# Patient Record
Sex: Female | Born: 1960
Health system: Southern US, Community
[De-identification: ages and names within clinical notes are randomized; demographics above are authoritative.]

## PROBLEM LIST (undated history)

## (undated) DIAGNOSIS — M1990 Unspecified osteoarthritis, unspecified site: Secondary | ICD-10-CM

## (undated) DIAGNOSIS — I10 Essential (primary) hypertension: Secondary | ICD-10-CM

---

## 2004-05-31 ENCOUNTER — Other Ambulatory Visit: Admission: RE | Admit: 2004-05-31 | Discharge: 2004-05-31 | Payer: Self-pay | Admitting: Family Medicine

## 2005-05-13 ENCOUNTER — Emergency Department (HOSPITAL_COMMUNITY): Admission: EM | Admit: 2005-05-13 | Discharge: 2005-05-13 | Payer: Self-pay | Admitting: Emergency Medicine

## 2007-03-12 ENCOUNTER — Ambulatory Visit (HOSPITAL_COMMUNITY): Admission: RE | Admit: 2007-03-12 | Discharge: 2007-03-12 | Payer: Self-pay | Admitting: Family Medicine

## 2007-03-13 ENCOUNTER — Ambulatory Visit (HOSPITAL_COMMUNITY): Admission: RE | Admit: 2007-03-13 | Discharge: 2007-03-13 | Payer: Self-pay | Admitting: Family Medicine

## 2007-03-20 ENCOUNTER — Encounter (INDEPENDENT_AMBULATORY_CARE_PROVIDER_SITE_OTHER): Payer: Self-pay | Admitting: Diagnostic Radiology

## 2007-03-20 ENCOUNTER — Other Ambulatory Visit: Admission: RE | Admit: 2007-03-20 | Discharge: 2007-03-20 | Payer: Self-pay | Admitting: Diagnostic Radiology

## 2007-03-20 ENCOUNTER — Encounter: Admission: RE | Admit: 2007-03-20 | Discharge: 2007-03-20 | Payer: Self-pay | Admitting: Family Medicine

## 2010-06-19 ENCOUNTER — Encounter: Payer: Self-pay | Admitting: Endocrinology

## 2011-01-09 ENCOUNTER — Encounter: Payer: Self-pay | Admitting: Podiatry

## 2011-07-31 ENCOUNTER — Ambulatory Visit: Payer: Self-pay | Admitting: Obstetrics and Gynecology

## 2011-08-21 ENCOUNTER — Ambulatory Visit (INDEPENDENT_AMBULATORY_CARE_PROVIDER_SITE_OTHER): Payer: 59 | Admitting: Obstetrics and Gynecology

## 2011-08-21 DIAGNOSIS — E559 Vitamin D deficiency, unspecified: Secondary | ICD-10-CM

## 2011-08-21 DIAGNOSIS — Z01419 Encounter for gynecological examination (general) (routine) without abnormal findings: Secondary | ICD-10-CM

## 2012-08-06 ENCOUNTER — Ambulatory Visit: Payer: 59 | Admitting: Obstetrics and Gynecology

## 2014-01-29 ENCOUNTER — Other Ambulatory Visit: Payer: Self-pay | Admitting: Gastroenterology

## 2014-05-29 HISTORY — PX: OTHER SURGICAL HISTORY: SHX169

## 2014-11-10 DIAGNOSIS — M25561 Pain in right knee: Secondary | ICD-10-CM | POA: Insufficient documentation

## 2014-11-11 ENCOUNTER — Other Ambulatory Visit: Payer: Self-pay | Admitting: Orthopedic Surgery

## 2014-11-11 DIAGNOSIS — M25561 Pain in right knee: Secondary | ICD-10-CM

## 2014-11-13 ENCOUNTER — Ambulatory Visit
Admission: RE | Admit: 2014-11-13 | Discharge: 2014-11-13 | Disposition: A | Discharge status: Home / Self Care | Payer: Self-pay | Source: Ambulatory Visit | Attending: Orthopedic Surgery | Admitting: Orthopedic Surgery

## 2014-11-13 DIAGNOSIS — M25561 Pain in right knee: Secondary | ICD-10-CM

## 2014-11-17 DIAGNOSIS — S83206A Unspecified tear of unspecified meniscus, current injury, right knee, initial encounter: Secondary | ICD-10-CM | POA: Insufficient documentation

## 2014-12-22 DIAGNOSIS — Z9889 Other specified postprocedural states: Secondary | ICD-10-CM | POA: Insufficient documentation

## 2015-06-03 DIAGNOSIS — M1711 Unilateral primary osteoarthritis, right knee: Secondary | ICD-10-CM | POA: Insufficient documentation

## 2015-10-18 ENCOUNTER — Other Ambulatory Visit: Payer: Self-pay | Admitting: Orthopedic Surgery

## 2015-10-18 DIAGNOSIS — M25561 Pain in right knee: Secondary | ICD-10-CM

## 2015-10-23 ENCOUNTER — Other Ambulatory Visit: Payer: Commercial Managed Care - PPO

## 2015-11-17 ENCOUNTER — Inpatient Hospital Stay: Admission: RE | Admit: 2015-11-17 | Payer: Commercial Managed Care - PPO | Source: Ambulatory Visit

## 2016-03-10 ENCOUNTER — Other Ambulatory Visit: Payer: Self-pay | Admitting: Orthopedic Surgery

## 2016-03-10 DIAGNOSIS — M25561 Pain in right knee: Secondary | ICD-10-CM

## 2016-03-24 ENCOUNTER — Ambulatory Visit
Admission: RE | Admit: 2016-03-24 | Discharge: 2016-03-24 | Disposition: A | Discharge status: Home / Self Care | Payer: PRIVATE HEALTH INSURANCE | Source: Ambulatory Visit | Attending: Orthopedic Surgery | Admitting: Orthopedic Surgery

## 2016-03-24 DIAGNOSIS — M25561 Pain in right knee: Secondary | ICD-10-CM

## 2017-08-20 DIAGNOSIS — L089 Local infection of the skin and subcutaneous tissue, unspecified: Secondary | ICD-10-CM | POA: Insufficient documentation

## 2017-08-20 DIAGNOSIS — L669 Cicatricial alopecia, unspecified: Secondary | ICD-10-CM | POA: Insufficient documentation

## 2018-06-17 ENCOUNTER — Other Ambulatory Visit: Payer: Self-pay | Admitting: Podiatry

## 2018-06-17 ENCOUNTER — Encounter: Payer: Self-pay | Admitting: Podiatry

## 2018-06-17 ENCOUNTER — Ambulatory Visit (INDEPENDENT_AMBULATORY_CARE_PROVIDER_SITE_OTHER): Payer: Managed Care, Other (non HMO)

## 2018-06-17 ENCOUNTER — Ambulatory Visit (INDEPENDENT_AMBULATORY_CARE_PROVIDER_SITE_OTHER): Payer: Managed Care, Other (non HMO) | Admitting: Podiatry

## 2018-06-17 VITALS — BP 116/81 | HR 81 | Resp 16

## 2018-06-17 DIAGNOSIS — M25571 Pain in right ankle and joints of right foot: Secondary | ICD-10-CM | POA: Diagnosis not present

## 2018-06-17 DIAGNOSIS — M779 Enthesopathy, unspecified: Secondary | ICD-10-CM

## 2018-06-17 DIAGNOSIS — M722 Plantar fascial fibromatosis: Secondary | ICD-10-CM

## 2018-06-17 DIAGNOSIS — E559 Vitamin D deficiency, unspecified: Secondary | ICD-10-CM | POA: Insufficient documentation

## 2018-06-17 DIAGNOSIS — I10 Essential (primary) hypertension: Secondary | ICD-10-CM | POA: Insufficient documentation

## 2018-06-17 DIAGNOSIS — Z780 Asymptomatic menopausal state: Secondary | ICD-10-CM | POA: Insufficient documentation

## 2018-06-17 DIAGNOSIS — M25572 Pain in left ankle and joints of left foot: Secondary | ICD-10-CM

## 2018-06-17 MED ORDER — DICLOFENAC SODIUM 75 MG PO TBEC: 75.0000 mg | DELAYED_RELEASE_TABLET | Freq: Two times a day (BID) | ORAL | 2 refills | Status: DC

## 2018-06-17 MED ORDER — TRIAMCINOLONE ACETONIDE 10 MG/ML IJ SUSP
10.0000 mg | Freq: Once | INTRAMUSCULAR | Status: AC
  Administered 2018-06-17: 10 mg

## 2018-06-17 NOTE — Patient Instructions (Signed)

## 2018-06-17 NOTE — Progress Notes (Signed)
   Subjective:    Patient ID: Heather Randall, female    DOB: 10/20/1960, 58 y.o.   MRN: [id number]  HPI    Review of Systems  All other systems reviewed and are negative.      Objective:   Physical Exam        Assessment & Plan:

## 2018-06-18 NOTE — Progress Notes (Signed)
Subjective:   Patient ID: Heather Randall, female   DOB: 58 y.o.   MRN: [id number]   HPI Patient presents stating she has had a lot of pain in her right heel and ankle and moderately in her left and states she feels like she is been walking differently and is becoming difficult for her to be active.  States is gotten worse over the last few months and she does not remember specific injury and patient does not smoke and likes to be active   Review of Systems  All other systems reviewed and are negative.       Objective:  Physical Exam Vitals signs and nursing note reviewed.  Constitutional:      Appearance: She is well-developed.  Pulmonary:     Effort: Pulmonary effort is normal.  Musculoskeletal: Normal range of motion.  Skin:    General: Skin is warm.  Neurological:     Mental Status: She is alert.     Neurovascular status found to be intact muscle strength is adequate range of motion within normal limits with patient noted to have exquisite discomfort plantar aspect right heel and into the sinus tarsi right with inflammation fluid buildup.  Left is mildly sore but not to the same degree and patient is noted to have moderate flatfoot deformity bilateral and has good digital perfusion well oriented x3    Assessment:  Probability for acute plantar fasciitis right with change in gait pattern leading to acute sinus tarsitis with moderate plantar fasciitis left     Plan:  H&P conditions reviewed and at this time I am focusing on the right foot.  I injected the right plantar fascia at the insertion 3 mg Kenalog 5 mg Xylocaine and for the left I did sterile prep and injected the sinus tarsi 3 mg Kenalog 5 mg Xylocaine and applied fascial brace to hold of the arch.  Placed on diclofenac 75 mg twice daily and gave instructions on supportive shoes physical therapy and will be seen back again in the next several weeks  X-rays indicate that there is moderate depression of the arch with spur  formation plantar and did not note any other pathology

## 2018-07-15 ENCOUNTER — Encounter: Payer: Self-pay | Admitting: Podiatry

## 2018-07-15 ENCOUNTER — Ambulatory Visit (INDEPENDENT_AMBULATORY_CARE_PROVIDER_SITE_OTHER): Payer: Managed Care, Other (non HMO) | Admitting: Podiatry

## 2018-07-15 DIAGNOSIS — M779 Enthesopathy, unspecified: Secondary | ICD-10-CM

## 2018-07-15 DIAGNOSIS — M216X1 Other acquired deformities of right foot: Secondary | ICD-10-CM

## 2018-07-15 DIAGNOSIS — M216X2 Other acquired deformities of left foot: Secondary | ICD-10-CM

## 2018-07-15 DIAGNOSIS — M722 Plantar fascial fibromatosis: Secondary | ICD-10-CM

## 2018-07-17 NOTE — Progress Notes (Signed)
Subjective:   Patient ID: Heather Randall, female   DOB: 58 y.o.   MRN: [id number]   HPI Patient presents concerned because her feet are collapsing inwards and states that her heels seem to be feeling quite a bit better currently   ROS      Objective:  Physical Exam  Neurovascular status intact with moderate depression of the arch noted bilateral with minimal discomfort upon palpation plantar fascial bilateral     Assessment:  Acute plantar fasciitis bilateral that appears to be improving     Plan:  H&P conditions reviewed and I have recommended long-term orthotics to provide for full support.  Patient wants orthotics and at this point I went ahead and I scanned for customized orthotic devices educating her on them and she will be seen back when ready and will have them dispensed by ped orthotist

## 2018-08-15 ENCOUNTER — Ambulatory Visit (INDEPENDENT_AMBULATORY_CARE_PROVIDER_SITE_OTHER): Payer: Managed Care, Other (non HMO) | Admitting: Orthotics

## 2018-08-15 ENCOUNTER — Other Ambulatory Visit: Payer: Self-pay

## 2018-08-15 DIAGNOSIS — M722 Plantar fascial fibromatosis: Secondary | ICD-10-CM

## 2018-08-15 DIAGNOSIS — M779 Enthesopathy, unspecified: Secondary | ICD-10-CM

## 2018-08-15 NOTE — Progress Notes (Signed)
Patient came in today to pick up custom made foot orthotics.  The goals were accomplished and the patient reported no dissatisfaction with said orthotics.  Patient was advised of breakin period and how to report any issues. 

## 2018-09-17 DIAGNOSIS — R05 Cough: Secondary | ICD-10-CM | POA: Diagnosis not present

## 2018-10-17 DIAGNOSIS — R69 Illness, unspecified: Secondary | ICD-10-CM | POA: Diagnosis not present

## 2018-10-17 DIAGNOSIS — G4700 Insomnia, unspecified: Secondary | ICD-10-CM | POA: Diagnosis not present

## 2018-10-17 DIAGNOSIS — J9801 Acute bronchospasm: Secondary | ICD-10-CM | POA: Diagnosis not present

## 2018-10-17 DIAGNOSIS — K219 Gastro-esophageal reflux disease without esophagitis: Secondary | ICD-10-CM | POA: Diagnosis not present

## 2018-10-17 DIAGNOSIS — I10 Essential (primary) hypertension: Secondary | ICD-10-CM | POA: Diagnosis not present

## 2018-11-21 DIAGNOSIS — R05 Cough: Secondary | ICD-10-CM | POA: Diagnosis not present

## 2018-11-21 DIAGNOSIS — Z20828 Contact with and (suspected) exposure to other viral communicable diseases: Secondary | ICD-10-CM | POA: Diagnosis not present

## 2018-11-21 DIAGNOSIS — M791 Myalgia, unspecified site: Secondary | ICD-10-CM | POA: Diagnosis not present

## 2018-11-27 ENCOUNTER — Emergency Department (HOSPITAL_COMMUNITY): Payer: 59

## 2018-11-27 ENCOUNTER — Inpatient Hospital Stay (HOSPITAL_COMMUNITY)
Admission: EM | Admit: 2018-11-27 | Discharge: 2018-12-02 | DRG: 177 | Disposition: A | Discharge status: Home / Self Care | Payer: 59 | Attending: Internal Medicine | Admitting: Internal Medicine

## 2018-11-27 ENCOUNTER — Encounter (HOSPITAL_COMMUNITY): Payer: Self-pay

## 2018-11-27 ENCOUNTER — Other Ambulatory Visit: Payer: Self-pay

## 2018-11-27 DIAGNOSIS — J9601 Acute respiratory failure with hypoxia: Secondary | ICD-10-CM | POA: Diagnosis not present

## 2018-11-27 DIAGNOSIS — Z79899 Other long term (current) drug therapy: Secondary | ICD-10-CM | POA: Diagnosis not present

## 2018-11-27 DIAGNOSIS — J9621 Acute and chronic respiratory failure with hypoxia: Secondary | ICD-10-CM | POA: Diagnosis present

## 2018-11-27 DIAGNOSIS — R0602 Shortness of breath: Secondary | ICD-10-CM

## 2018-11-27 DIAGNOSIS — Z713 Dietary counseling and surveillance: Secondary | ICD-10-CM | POA: Diagnosis not present

## 2018-11-27 DIAGNOSIS — E669 Obesity, unspecified: Secondary | ICD-10-CM | POA: Diagnosis not present

## 2018-11-27 DIAGNOSIS — U071 COVID-19: Secondary | ICD-10-CM | POA: Diagnosis not present

## 2018-11-27 DIAGNOSIS — N289 Disorder of kidney and ureter, unspecified: Secondary | ICD-10-CM | POA: Diagnosis present

## 2018-11-27 DIAGNOSIS — I10 Essential (primary) hypertension: Secondary | ICD-10-CM | POA: Diagnosis present

## 2018-11-27 DIAGNOSIS — J1289 Other viral pneumonia: Secondary | ICD-10-CM | POA: Diagnosis not present

## 2018-11-27 DIAGNOSIS — Z6832 Body mass index (BMI) 32.0-32.9, adult: Secondary | ICD-10-CM

## 2018-11-27 DIAGNOSIS — E559 Vitamin D deficiency, unspecified: Secondary | ICD-10-CM | POA: Diagnosis present

## 2018-11-27 DIAGNOSIS — J069 Acute upper respiratory infection, unspecified: Secondary | ICD-10-CM

## 2018-11-27 DIAGNOSIS — E871 Hypo-osmolality and hyponatremia: Secondary | ICD-10-CM | POA: Diagnosis not present

## 2018-11-27 DIAGNOSIS — Z20828 Contact with and (suspected) exposure to other viral communicable diseases: Secondary | ICD-10-CM | POA: Diagnosis present

## 2018-11-27 DIAGNOSIS — Z888 Allergy status to other drugs, medicaments and biological substances status: Secondary | ICD-10-CM

## 2018-11-27 DIAGNOSIS — F988 Other specified behavioral and emotional disorders with onset usually occurring in childhood and adolescence: Secondary | ICD-10-CM | POA: Diagnosis present

## 2018-11-27 DIAGNOSIS — J1282 Pneumonia due to coronavirus disease 2019: Secondary | ICD-10-CM | POA: Diagnosis present

## 2018-11-27 DIAGNOSIS — R05 Cough: Secondary | ICD-10-CM | POA: Diagnosis not present

## 2018-11-27 DIAGNOSIS — Z006 Encounter for examination for normal comparison and control in clinical research program: Secondary | ICD-10-CM

## 2018-11-27 DIAGNOSIS — R69 Illness, unspecified: Secondary | ICD-10-CM | POA: Diagnosis not present

## 2018-11-27 HISTORY — DX: Unspecified osteoarthritis, unspecified site: M19.90

## 2018-11-27 HISTORY — DX: Essential (primary) hypertension: I10

## 2018-11-27 LAB — FIBRINOGEN: Fibrinogen: 697 mg/dL — ABNORMAL HIGH (ref 210–475)

## 2018-11-27 LAB — COMPREHENSIVE METABOLIC PANEL
ALT: 9 U/L (ref 0–44)
AST: 37 U/L (ref 15–41)
Albumin: 3.8 g/dL (ref 3.5–5.0)
Alkaline Phosphatase: 95 U/L (ref 38–126)
Anion gap: 10 (ref 5–15)
BUN: 18 mg/dL (ref 6–20)
CO2: 26 mmol/L (ref 22–32)
Calcium: 8.9 mg/dL (ref 8.9–10.3)
Chloride: 98 mmol/L (ref 98–111)
Creatinine, Ser: 1.16 mg/dL — ABNORMAL HIGH (ref 0.44–1.00)
GFR calc Af Amer: >60 mL/min (ref >60)
GFR calc non Af Amer: 53 mL/min — ABNORMAL LOW (ref >60)
Glucose, Bld: 95 mg/dL (ref 70–99)
Potassium: 3.9 mmol/L (ref 3.5–5.1)
Sodium: 134 mmol/L — ABNORMAL LOW (ref 135–145)
Total Bilirubin: 0.4 mg/dL (ref 0.3–1.2)
Total Protein: 8.2 g/dL — ABNORMAL HIGH (ref 6.5–8.1)

## 2018-11-27 LAB — CBC WITH DIFFERENTIAL/PLATELET
Abs Immature Granulocytes: 0.02 10*3/uL (ref 0.00–0.07)
Basophils Absolute: 0 10*3/uL (ref 0.0–0.1)
Basophils Relative: 0 %
Eosinophils Absolute: 0 10*3/uL (ref 0.0–0.5)
Eosinophils Relative: 0 %
HCT: 42.2 % (ref 36.0–46.0)
Hemoglobin: 14.1 g/dL (ref 12.0–15.0)
Immature Granulocytes: 1 %
Lymphocytes Relative: 13 %
Lymphs Abs: 0.5 10*3/uL — ABNORMAL LOW (ref 0.7–4.0)
MCH: 28.4 pg (ref 26.0–34.0)
MCHC: 33.4 g/dL (ref 30.0–36.0)
MCV: 85.1 fL (ref 80.0–100.0)
Monocytes Absolute: 0.3 10*3/uL (ref 0.1–1.0)
Monocytes Relative: 8 %
Neutro Abs: 3.2 10*3/uL (ref 1.7–7.7)
Neutrophils Relative %: 78 %
Platelets: 181 10*3/uL (ref 150–400)
RBC: 4.96 MIL/uL (ref 3.87–5.11)
RDW: 13.7 % (ref 11.5–15.5)
WBC: 4 10*3/uL (ref 4.0–10.5)
nRBC: 0 % (ref 0.0–0.2)

## 2018-11-27 LAB — LACTIC ACID, PLASMA: Lactic Acid, Venous: 0.9 mmol/L (ref 0.5–1.9)

## 2018-11-27 LAB — C-REACTIVE PROTEIN: CRP: 15 mg/dL — ABNORMAL HIGH (ref <1.0)

## 2018-11-27 LAB — FERRITIN: Ferritin: 231 ng/mL (ref 11–307)

## 2018-11-27 LAB — PROCALCITONIN: Procalcitonin: <0.1 ng/mL

## 2018-11-27 LAB — D-DIMER, QUANTITATIVE: D-Dimer, Quant: 1.13 ug/mL-FEU — ABNORMAL HIGH (ref 0.00–0.50)

## 2018-11-27 LAB — LACTATE DEHYDROGENASE: LDH: 463 U/L — ABNORMAL HIGH (ref 98–192)

## 2018-11-27 LAB — TRIGLYCERIDES: Triglycerides: 114 mg/dL (ref <150)

## 2018-11-27 LAB — I-STAT BETA HCG BLOOD, ED (MC, WL, AP ONLY): I-stat hCG, quantitative: <5 m[IU]/mL (ref <5)

## 2018-11-27 LAB — TSH: TSH: 0.989 u[IU]/mL (ref 0.350–4.500)

## 2018-11-27 MED ORDER — SODIUM CHLORIDE 0.9 % IV SOLN
200.0000 mg | Freq: Once | INTRAVENOUS | Status: AC
  Administered 2018-11-27: 200 mg via INTRAVENOUS
  Filled 2018-11-27: qty 40

## 2018-11-27 MED ORDER — ENOXAPARIN SODIUM 40 MG/0.4ML ~~LOC~~ SOLN: 40.0000 mg | SUBCUTANEOUS | Status: DC

## 2018-11-27 MED ORDER — ENOXAPARIN SODIUM 40 MG/0.4ML ~~LOC~~ SOLN
40.0000 mg | SUBCUTANEOUS | Status: DC
  Administered 2018-11-27 – 2018-12-01 (×5): 40 mg via SUBCUTANEOUS
  Filled 2018-11-27 (×5): qty 0.4

## 2018-11-27 MED ORDER — BISACODYL 10 MG RE SUPP: 10.0000 mg | Freq: Every day | RECTAL | Status: DC | PRN

## 2018-11-27 MED ORDER — AMLODIPINE BESYLATE 5 MG PO TABS
5.0000 mg | ORAL_TABLET | Freq: Every day | ORAL | Status: DC
  Administered 2018-11-28 – 2018-12-02 (×5): 5 mg via ORAL
  Filled 2018-11-27 (×5): qty 1

## 2018-11-27 MED ORDER — ORAL CARE MOUTH RINSE
15.0000 mL | Freq: Two times a day (BID) | OROMUCOSAL | Status: DC
  Administered 2018-11-27 – 2018-12-02 (×10): 15 mL via OROMUCOSAL

## 2018-11-27 MED ORDER — METHYLPREDNISOLONE SODIUM SUCC 125 MG IJ SOLR
80.0000 mg | Freq: Once | INTRAMUSCULAR | Status: AC
  Administered 2018-11-27: 80 mg via INTRAVENOUS
  Filled 2018-11-27: qty 2

## 2018-11-27 MED ORDER — ONDANSETRON HCL 4 MG/2ML IJ SOLN: 4.0000 mg | Freq: Four times a day (QID) | INTRAMUSCULAR | Status: DC | PRN

## 2018-11-27 MED ORDER — SODIUM CHLORIDE 0.9 % IV SOLN
INTRAVENOUS | Status: DC | PRN
  Administered 2018-11-27 – 2018-11-28 (×2): 250 mL via INTRAVENOUS

## 2018-11-27 MED ORDER — ONDANSETRON HCL 4 MG/2ML IJ SOLN
4.0000 mg | Freq: Four times a day (QID) | INTRAMUSCULAR | Status: DC | PRN
  Administered 2018-11-27 – 2018-12-01 (×2): 4 mg via INTRAVENOUS
  Filled 2018-11-27 (×2): qty 2

## 2018-11-27 MED ORDER — ACETAMINOPHEN 650 MG RE SUPP: 650.0000 mg | Freq: Four times a day (QID) | RECTAL | Status: DC | PRN

## 2018-11-27 MED ORDER — FLUTICASONE FUROATE-VILANTEROL 100-25 MCG/INH IN AEPB
1.0000 | INHALATION_SPRAY | Freq: Every day | RESPIRATORY_TRACT | Status: DC
  Administered 2018-11-28 – 2018-11-30 (×3): 1 via RESPIRATORY_TRACT
  Filled 2018-11-27 (×2): qty 28

## 2018-11-27 MED ORDER — ONDANSETRON HCL 4 MG PO TABS: 4.0000 mg | ORAL_TABLET | Freq: Four times a day (QID) | ORAL | Status: DC | PRN

## 2018-11-27 MED ORDER — ACETAMINOPHEN 325 MG PO TABS
650.0000 mg | ORAL_TABLET | Freq: Once | ORAL | Status: AC
  Administered 2018-11-27: 650 mg via ORAL
  Filled 2018-11-27: qty 2

## 2018-11-27 MED ORDER — POLYETHYLENE GLYCOL 3350 17 G PO PACK
17.0000 g | PACK | Freq: Every day | ORAL | Status: DC | PRN
  Administered 2018-11-29: 17 g via ORAL
  Filled 2018-11-27: qty 1

## 2018-11-27 MED ORDER — AMPHETAMINE-DEXTROAMPHET ER 10 MG PO CP24
30.0000 mg | ORAL_CAPSULE | ORAL | Status: DC
  Filled 2018-11-27: qty 3

## 2018-11-27 MED ORDER — SODIUM CHLORIDE 0.9 % IV SOLN
100.0000 mg | INTRAVENOUS | Status: AC
  Administered 2018-11-28 – 2018-12-01 (×4): 100 mg via INTRAVENOUS
  Filled 2018-11-27 (×5): qty 20

## 2018-11-27 MED ORDER — IPRATROPIUM-ALBUTEROL 20-100 MCG/ACT IN AERS
1.0000 | INHALATION_SPRAY | Freq: Four times a day (QID) | RESPIRATORY_TRACT | Status: DC
  Administered 2018-11-27 – 2018-11-30 (×10): 1 via RESPIRATORY_TRACT
  Filled 2018-11-27: qty 4

## 2018-11-27 MED ORDER — ACETAMINOPHEN 325 MG PO TABS
650.0000 mg | ORAL_TABLET | Freq: Four times a day (QID) | ORAL | Status: DC | PRN
  Administered 2018-11-30: 650 mg via ORAL
  Filled 2018-11-27: qty 2

## 2018-11-27 MED ORDER — METHYLPREDNISOLONE SODIUM SUCC 125 MG IJ SOLR
60.0000 mg | Freq: Three times a day (TID) | INTRAMUSCULAR | Status: DC
  Administered 2018-11-28 – 2018-12-01 (×11): 60 mg via INTRAVENOUS
  Filled 2018-11-27 (×11): qty 2

## 2018-11-27 MED ORDER — GUAIFENESIN ER 600 MG PO TB12
600.0000 mg | ORAL_TABLET | Freq: Two times a day (BID) | ORAL | Status: DC
  Administered 2018-11-27 – 2018-12-02 (×10): 600 mg via ORAL
  Filled 2018-11-27 (×10): qty 1

## 2018-11-27 NOTE — Progress Notes (Signed)
Pharmacy Note - Remdesivir Dosing  O:  ALT: 9 CXR: multifocal pneumonia SpO2: 92% on 5L   A/P:  Patient meets criteria for remdesivir.  Begin remdesivir 200 mg IV x 1, followed by 100 mg IV daily x 4 days  Monitor ALT, clinical progress  Peggyann Juba, PharmD, [place] [phone] 11/27/2018 7:15 PM

## 2018-11-27 NOTE — ED Notes (Signed)
[facility] has been notified of patient needing transfer to [facility] and will send truck ASAP.

## 2018-11-27 NOTE — ED Triage Notes (Signed)
Patient arrived via POV. Patient is AOx4 and ambulatory. Patient was confirmed positive for COVID yesterday. Patient arrived with chief complaint of SOB with and without exertion, Cough and Fever.   Patient on room air is 87-89% O2 Saturation. ED Provider made aware. Patient put on 5L Nasal Canula and has O2 Saturation of 93-94%.

## 2018-11-27 NOTE — Progress Notes (Signed)
Pt arrived on unit at [facility]. Orient patient to room and VSS.

## 2018-11-27 NOTE — H&P (Signed)
History and Physical    GERALENE Randall SLH:[id number] DOB: 07/17/1960 DOA: 11/27/2018  PCP: Shirline Frees, MD   Patient coming from: Home  Chief Complaint: Shortness of Breath  HPI: Heather Randall is a 58 y.o. female with medical history significant of of osteoarthritis, hypertension, ADD and other comorbidities who presents with chief complaint of worsening shortness of breath that started last Wednesday associated with nonproductive cough and fevers and chills.  Patient has been recently exposed to COVID-19 disease by her son who came home for Father's Day and he was ill at that time.  They started developing symptoms last Wednesday and was tested and test results came back yesterday and she was positive.  Patient had worsening shortness of breath on exertion as well as with rest because of her cough and fever she presented to the ED for further evaluation.  She denies any chest pain, lightheadedness or dizziness but does feel fatigued and states that she has been extremely short of breath.  States that she is never felt like this before.  TR[facility] was called admit this patient as she is COVID-19 disease positive associated with a pneumonia.  She will be transferred to [facility]  ED Course: In the ED she was worked up and given acetaminophen and placed on supplemental oxygen.  She had basic blood work done as well as a chest x-ray which showed multifocal pneumonia.  Of note she tested positive for COVID-19 disease yesterday and results were called to her yesterday.  Review of Systems: As per HPI otherwise 10 point review of systems negative.   Past Medical History:  Diagnosis Date  . Hypertension   . Osteoarthritis    Past Surgical History:  Procedure Laterality Date  . Right Knee Arthroscopy  2016   SOCIAL HISTORY  reports that she has never smoked. She has never used smokeless tobacco. She reports current alcohol use. She reports that she does not use drugs.  Allergies  Allergen  Reactions  . Catapres [Clonidine Hcl]     Pt stated, "It burns my skin"   FAMILY HISTORY  Family History  Problem Relation Age of Onset  . Hypertension Mother   . Multiple myeloma Mother   . Osteoarthritis Mother   . Diabetes Mellitus II Father   . Osteoarthritis Father   . Hypertension Father    Prior to Admission medications   Medication Sig Start Date End Date Taking? Authorizing Provider  amLODipine (NORVASC) 5 MG tablet Take 5 mg by mouth daily.    Yes [provider]  amphetamine-dextroamphetamine (ADDERALL XR) 30 MG 24 hr capsule Take 30 mg by mouth daily.   Yes [provider]  BREO ELLIPTA 100-25 MCG/INH AEPB Inhale 1 puff into the lungs daily.  10/17/18  Yes [provider]  hydrochlorothiazide (HYDRODIURIL) 12.5 MG tablet Take 12.5 mg by mouth daily.  11/24/18  Yes [provider]  irbesartan (AVAPRO) 300 MG tablet Take 300 mg by mouth daily.  09/23/14  Yes [provider]  diclofenac (VOLTAREN) 75 MG EC tablet Take 1 tablet (75 mg total) by mouth 2 (two) times daily. Patient not taking: Reported on 11/27/2018 06/17/18   Wallene Huh, [place]   Physical Exam: Vitals:   11/27/18 1300 11/27/18 1400 11/27/18 1430 11/27/18 1500  BP: 111/79 111/84 116/81 109/79  Pulse: 84 80 80 78  Resp: (!) 25 20 (!) 22 (!) 21  Temp:      TempSrc:      SpO2: 93%  92% 93% 92%  Weight:      Height:       Constitutional: WN/WD obese AAF in mild respiratory distress and appears calm but uncomfortable Eyes: Lids and conjunctivae normal, sclerae anicteric  ENMT: External Ears, Nose appear normal. Grossly normal hearing.  Neck: Appears normal, supple, no cervical masses, normal ROM, no appreciable thyromegaly Respiratory: Diminished to auscultation bilaterally with Rhonchi and Wheezing. Increased respiratory effort and patient is not tachypenic. Wearing 5 Liters of supplemental O2 via [place] Cardiovascular: RRR, no murmurs / rubs / gallops. S1 and S2  auscultated. No extremity edema. Abdomen: Soft, non-tender, Distended due to body habitus. No masses palpated. No appreciable hepatosplenomegaly. Bowel sounds positive x4.  GU: Deferred. Musculoskeletal: No clubbing / cyanosis of digits/nails. No joint deformity upper and lower extremities Skin: No rashes, lesions, ulcers on a limited skin evaluation. No induration; Warm and dry.  Neurologic: CN 2-12 grossly intact with no focal deficits. Romberg sign and cerebellar reflexes not assessed.  Psychiatric: Normal judgment and insight. Alert and oriented x 3. Anxious mood and appropriate affect.   Labs on Admission: I have personally reviewed following labs and imaging studies  CBC: Recent Labs  Lab 11/27/18 1128  WBC 4.0  NEUTROABS 3.2  HGB 14.1  HCT 42.2  MCV 85.1  PLT 115   Basic Metabolic Panel: Recent Labs  Lab 11/27/18 1128  NA 134*  K 3.9  CL 98  CO2 26  GLUCOSE 95  BUN 18  CREATININE 1.16*  CALCIUM 8.9   GFR: Estimated Creatinine Clearance: 68.9 mL/min (A) (by C-G formula based on SCr of 1.16 mg/dL (H)). Liver Function Tests: Recent Labs  Lab 11/27/18 1128  AST 37  ALT 9  ALKPHOS 95  BILITOT 0.4  PROT 8.2*  ALBUMIN 3.8   No results for input(s): LIPASE, AMYLASE in the last 168 hours. No results for input(s): AMMONIA in the last 168 hours. Coagulation Profile: No results for input(s): INR, PROTIME in the last 168 hours. Cardiac Enzymes: No results for input(s): CKTOTAL, CKMB, CKMBINDEX, TROPONINI in the last 168 hours. BNP (last 3 results) No results for input(s): PROBNP in the last 8760 hours. HbA1C: No results for input(s): HGBA1C in the last 72 hours. CBG: No results for input(s): GLUCAP in the last 168 hours. Lipid Profile: Recent Labs    11/27/18 1128  TRIG 114   Thyroid Function Tests: No results for input(s): TSH, T4TOTAL, FREET4, T3FREE, THYROIDAB in the last 72 hours. Anemia Panel: Recent Labs    11/27/18 1128  FERRITIN 231   Urine  analysis: No results found for: COLORURINE, APPEARANCEUR, LABSPEC, PHURINE, GLUCOSEU, HGBUR, BILIRUBINUR, KETONESUR, PROTEINUR, UROBILINOGEN, NITRITE, LEUKOCYTESUR Sepsis Labs: !!!!!!!!!!!!!!!!!!!!!!!!!!!!!!!!!!!!!!!!!!!! _0 (procalcitonin:4,lacticidven:4) )No results found for this or any previous visit (from the past 240 hour(s)).   Radiological Exams on Admission: Dg Chest Port 1 View  Result Date: 11/27/2018 CLINICAL DATA:  Cough, shortness of breath. EXAM: PORTABLE CHEST 1 VIEW COMPARISON:  None. FINDINGS: The heart size and mediastinal contours are within normal limits. No pneumothorax or pleural effusion is noted. Bilateral patchy airspace opacities are noted in the perihilar and basilar regions bilaterally, right greater than left. The visualized skeletal structures are unremarkable. IMPRESSION: Findings consistent with multifocal pneumonia. Electronically Signed   By: Marijo Conception M.D.   On: 11/27/2018 12:54   EKG: Independently reviewed.  Sinus rhythm at the rate of 91 and a QTC of 414.  There is no evidence of ST evaluation or depression on my interpretation.  Assessment/Plan Active  Problems:   Hypertensive disorder   Pneumonia due to COVID-19 virus   Acute on chronic respiratory failure with hypoxia (HCC)   COVID-19 virus infection   Acute respiratory disease due to COVID-19 virus  COVID-19 Disease -Tested last week and results came back yesterday.  Presents with shortness of breath with and without exertion along with a cough and fever.  Saturations on admission were 87 to 89% on room air and patient 5 L of supplemental oxygen via nasal cannula with improvement to 93 to 94% -DH was 463, triglycerides were 114, ferritin level was 231, CRP was 50.0, lactic acid level 0.9, procalcitonin level was less than 0.10, 3, and fibrinogen was 697 -Blood cultures x2 are pending -See below for chest x-ray  Acute Respiratory Failure 2/2 COVID-19 Disease and Multifocal PNA -Presents  with shortness of breath on exertion as well as rest along with fever and cough -Saturations on room air were 87 to 89% and placed on 5 L supplemental oxygen via nasal cannula improvement in oxygen saturations to 93 to 94% -Continue supplemental oxygen via nasal cannula and wean O2 as tolerated -Continuous pulse oximetry and maintain O2 saturations greater than 90%  Multifocal PNA -Presents with shortness of breath that started last Wednesday and has worsened associated with cough -CXR on Admission showed "The heart size and mediastinal contours are within normal limits. No pneumothorax or pleural effusion is noted. Bilateral patchy airspace opacities are noted in the perihilar and basilar regions bilaterally, right greater than left. The visualized skeletal structures are unremarkable." -Requiring 5 Liters of Supplemental O2 -Viral PNA so will not start Abx -C/w Supportive Care and Given IV Solumedrol 80 mg x1 -Started on Combivent Inhaler 1 puff IH q6h -Repeat CXR in AM -Per my Discussion with Dr. Waldron Labs patient to get Remdesivir at [facility]   Obesity -Estimated body mass index is 32.49 kg/m as calculated from the following:   Height as of this encounter: _0  (1.753 m).   Weight as of this encounter: 99.8 kg. -Weight Loss and Dietary Counseling given  Hyponatremia -Mild at 134 on Admission -Continue to Monitor and Trend -Repeat CBC in AM   Mild Renal Insufficiency  -BUN/Cr is 18/1.16 on Admission -Hold Irbesartan and HCTZ -Avoid Nephrotoxic Medications, Contrast Dyes, and Hypotension -Continue to Monitor and Trend -Repeat CMP in AM   DVT prophylaxis: Lovenox 40 mg sq q24h Code Status: FULL CODE Family Communication: No family present at bedside  Disposition Plan: Pending Clinical Improvement back to baseline  Consults called: None Admission status: Transfer to [facility] to the care of Dr. Oda Cogan   Severity of Illness: The appropriate patient status  for this patient is INPATIENT. Inpatient status is judged to be reasonable and necessary in order to provide the required intensity of service to ensure the patient's safety. The patient's presenting symptoms, physical exam findings, and initial radiographic and laboratory data in the context of their chronic comorbidities is felt to place them at high risk for further clinical deterioration. Furthermore, it is not anticipated that the patient will be medically stable for discharge from the hospital within 2 midnights of admission. The following factors support the patient status of inpatient.   " The patient's presenting symptoms include SOB and Cough. " The worrisome physical exam findings include Decreased breath sounds and rhonchi. " The initial radiographic and laboratory data are worrisome because of Multifocal PNA and Hypoxia. " The chronic co-morbidities include are listed as above.  * I certify that at  the point of admission it is my clinical judgment that the patient will require inpatient hospital care spanning beyond 2 midnights from the point of admission due to high intensity of service, high risk for further deterioration and high frequency of surveillance required.Kerney Elbe, D.O. [facility] PAGER is on [place]  If 7PM-7AM, please contact night-coverage [email or web address] Password [email or web address]  11/27/2018, 3:21 PM

## 2018-11-27 NOTE — ED Notes (Signed)
Bed: WA18 Expected date:  Expected time:  Means of arrival:  Comments: Neg Pressure  

## 2018-11-27 NOTE — ED Provider Notes (Signed)
[facility] DEPT Provider Note   CSN: [id number] Arrival date & time: 11/27/18  1037     History   Chief Complaint Chief Complaint  Patient presents with  . COVID +  . Shortness of Breath    HPI Heather Randall is a 58 y.o. female.     HPI Patient is a 58 year old female with a known diagnosis of COVID-19.  She was diagnosed approximately 6 days ago.  She reports progressive shortness of breath over the past several days and felt much worse today.  She has had low-grade fevers.  No history of structural lung disease.  Reports mild nausea without vomiting.  A few episodes of loose stool.  Denies abdominal pain.  No back pain.  Symptoms are moderate to severe in severity.  She was found to be hypoxic to the mid 80s on room air on arrival to the emergency department.  She feels much better with nasal cannula oxygen.    Past Medical History:  Diagnosis Date  . Hypertension   . Osteoarthritis     Patient Active Problem List   Diagnosis Date Noted  . Hypertensive disorder 06/17/2018  . Menopause present 06/17/2018  . Vitamin D deficiency 06/17/2018  . Central centrifugal scarring alopecia 08/20/2017  . Skin inflammation 08/20/2017  . Unilateral primary osteoarthritis, right knee 06/03/2015  . S/P arthroscopy of knee 12/22/2014  . Acute meniscal tear of right knee 11/17/2014  . Right knee pain 11/10/2014    Past Surgical History:  Procedure Laterality Date  . Right Knee Arthroscopy  2016     OB History   No obstetric history on file.      Home Medications    Prior to Admission medications   Medication Sig Start Date End Date Taking? Authorizing Provider  amLODipine (NORVASC) 5 MG tablet Take 5 mg by mouth daily.    Yes [provider]  amphetamine-dextroamphetamine (ADDERALL XR) 30 MG 24 hr capsule Take 30 mg by mouth daily.   Yes [provider]  BREO ELLIPTA 100-25 MCG/INH AEPB Inhale 1 puff into the lungs daily.   10/17/18  Yes [provider]  hydrochlorothiazide (HYDRODIURIL) 12.5 MG tablet Take 12.5 mg by mouth daily.  11/24/18  Yes [provider]  irbesartan (AVAPRO) 300 MG tablet Take 300 mg by mouth daily.  09/23/14  Yes [provider]  diclofenac (VOLTAREN) 75 MG EC tablet Take 1 tablet (75 mg total) by mouth 2 (two) times daily. Patient not taking: Reported on 11/27/2018 06/17/18   Wallene Huh, DPM    Family History History reviewed. No pertinent family history.  Social History Social History   Tobacco Use  . Smoking status: Never Smoker  . Smokeless tobacco: Never Used  Substance Use Topics  . Alcohol use: Yes    Comment: Socially / Ocassionally   . Drug use: Never     Allergies   Catapres [clonidine hcl]   Review of Systems Review of Systems  All other systems reviewed and are negative.    Physical Exam Updated Vital Signs BP 111/79   Pulse 84   Temp 100 F (37.8 C) (Oral)   Resp (!) 25   Ht 5\' 9"  (1.753 m)   Wt 99.8 kg   LMP 05/29/2012 (Approximate)   SpO2 93%   BMI 32.49 kg/m   Physical Exam Vitals signs and nursing note reviewed.  Constitutional:      General: She is not in acute distress.    Appearance:  She is well-developed.  HENT:     Head: Normocephalic and atraumatic.  Neck:     Musculoskeletal: Normal range of motion.  Cardiovascular:     Rate and Rhythm: Normal rate.  Pulmonary:     Effort: Pulmonary effort is normal. No tachypnea or accessory muscle usage.     Breath sounds: No stridor.  Abdominal:     General: There is no distension.     Palpations: Abdomen is soft.  Musculoskeletal: Normal range of motion.  Skin:    General: Skin is warm and dry.  Neurological:     Mental Status: She is alert and oriented to person, place, and time.  Psychiatric:        Judgment: Judgment normal.      ED Treatments / Results  Labs (all labs ordered are listed, but only abnormal results are displayed) Labs Reviewed   CBC WITH DIFFERENTIAL/PLATELET - Abnormal; Notable for the following components:      Result Value   Lymphs Abs 0.5 (*)    All other components within normal limits  COMPREHENSIVE METABOLIC PANEL - Abnormal; Notable for the following components:   Sodium 134 (*)    Creatinine, Ser 1.16 (*)    Total Protein 8.2 (*)    GFR calc non Af Amer 53 (*)    All other components within normal limits  D-DIMER, QUANTITATIVE (NOT AT [facility]) - Abnormal; Notable for the following components:   D-Dimer, Quant 1.13 (*)    All other components within normal limits  LACTATE DEHYDROGENASE - Abnormal; Notable for the following components:   LDH 463 (*)    All other components within normal limits  FIBRINOGEN - Abnormal; Notable for the following components:   Fibrinogen 697 (*)    All other components within normal limits  C-REACTIVE PROTEIN - Abnormal; Notable for the following components:   CRP 15.0 (*)    All other components within normal limits  CULTURE, BLOOD (ROUTINE X 2)  CULTURE, BLOOD (ROUTINE X 2)  LACTIC ACID, PLASMA  PROCALCITONIN  FERRITIN  TRIGLYCERIDES  LACTIC ACID, PLASMA  I-STAT BETA HCG BLOOD, ED ([facility], [facility], [facility] ONLY)    EKG EKG Interpretation  Date/Time:  Wednesday November 27 2018 10:59:34 EDT Ventricular Rate:  91 PR Interval:    QRS Duration: 83 QT Interval:  336 QTC Calculation: 414 R Axis:   -27 Text Interpretation:  Sinus rhythm Borderline left axis deviation No old tracing to compare Confirmed by Azalia Bilisampos, Myanna Ziesmer ([id number]) on 11/27/2018 11:20:01 AM   Radiology Dg Chest Port 1 View  Result Date: 11/27/2018 CLINICAL DATA:  Cough, shortness of breath. EXAM: PORTABLE CHEST 1 VIEW COMPARISON:  None. FINDINGS: The heart size and mediastinal contours are within normal limits. No pneumothorax or pleural effusion is noted. Bilateral patchy airspace opacities are noted in the perihilar and basilar regions bilaterally, right greater than left. The visualized skeletal structures are  unremarkable. IMPRESSION: Findings consistent with multifocal pneumonia. Electronically Signed   By: Lupita RaiderJames  Green Jr M.D.   On: 11/27/2018 12:54    Procedures .Critical Care Performed by: Azalia Bilisampos, Dajahnae Vondra, MD Authorized by: Azalia Bilisampos, Kennley Schwandt, MD   Critical care provider statement:    Critical care time (minutes):  32   Critical care was time spent personally by me on the following activities:  Discussions with consultants, evaluation of patient's response to treatment, examination of patient, ordering and performing treatments and interventions, ordering and review of laboratory studies, ordering and review of radiographic studies, pulse oximetry, re-evaluation of  patient's condition, obtaining history from patient or surrogate and review of old charts   (including critical care time)  Medications Ordered in ED Medications  acetaminophen (TYLENOL) tablet 650 mg (650 mg Oral Given 11/27/18 1152)     Initial Impression / Assessment and Plan / ED Course  I have reviewed the triage vital signs and the nursing notes.  Pertinent labs & imaging results that were available during my care of the patient were reviewed by me and considered in my medical decision making (see chart for details).        Patient with evidence of multifocal pneumonia and new acute hypoxia in the setting of known COVID-19 infection.  This is likely acute respiratory failure secondary to viral pneumonia.  [facility].  Patient updated.  No increased work of breathing now that she is on nasal cannula.  She feels much better.  She has no history of structural lung disease and hopefully she will do well.  We will need to follow closely while in the emergency department and during the inpatient stay  Case discussed with tried hospitalist for admission  Final Clinical Impressions(s) / ED Diagnoses   Final diagnoses:  Acute respiratory disease due to COVID-19 virus    ED Discharge  Orders    None       Azalia Bilisampos, Yer Castello, MD 11/27/18 1350

## 2018-11-27 NOTE — ED Notes (Signed)
Hospitalist at bedside 

## 2018-11-27 NOTE — ED Notes (Signed)
Report given to [place] for [facility], [place], Room 138.

## 2018-11-27 NOTE — Progress Notes (Signed)
Spoke with patient's husband, Lanny Hurst, to give him an update. He really appreciated the call and the care.

## 2018-11-28 ENCOUNTER — Inpatient Hospital Stay (HOSPITAL_COMMUNITY): Payer: 59

## 2018-11-28 LAB — COMPREHENSIVE METABOLIC PANEL
ALT: 8 U/L (ref 0–44)
AST: 32 U/L (ref 15–41)
Albumin: 3.3 g/dL — ABNORMAL LOW (ref 3.5–5.0)
Alkaline Phosphatase: 84 U/L (ref 38–126)
Anion gap: 10 (ref 5–15)
BUN: 20 mg/dL (ref 6–20)
CO2: 26 mmol/L (ref 22–32)
Calcium: 8.7 mg/dL — ABNORMAL LOW (ref 8.9–10.3)
Chloride: 100 mmol/L (ref 98–111)
Creatinine, Ser: 1.26 mg/dL — ABNORMAL HIGH (ref 0.44–1.00)
GFR calc Af Amer: 55 mL/min — ABNORMAL LOW (ref >60)
GFR calc non Af Amer: 48 mL/min — ABNORMAL LOW (ref >60)
Glucose, Bld: 161 mg/dL — ABNORMAL HIGH (ref 70–99)
Potassium: 4.8 mmol/L (ref 3.5–5.1)
Sodium: 136 mmol/L (ref 135–145)
Total Bilirubin: 0.3 mg/dL (ref 0.3–1.2)
Total Protein: 7.4 g/dL (ref 6.5–8.1)

## 2018-11-28 LAB — CBC WITH DIFFERENTIAL/PLATELET
Abs Immature Granulocytes: 0.02 10*3/uL (ref 0.00–0.07)
Basophils Absolute: 0 10*3/uL (ref 0.0–0.1)
Basophils Relative: 0 %
Eosinophils Absolute: 0 10*3/uL (ref 0.0–0.5)
Eosinophils Relative: 0 %
HCT: 39.3 % (ref 36.0–46.0)
Hemoglobin: 13.1 g/dL (ref 12.0–15.0)
Immature Granulocytes: 1 %
Lymphocytes Relative: 17 %
Lymphs Abs: 0.5 10*3/uL — ABNORMAL LOW (ref 0.7–4.0)
MCH: 27.9 pg (ref 26.0–34.0)
MCHC: 33.3 g/dL (ref 30.0–36.0)
MCV: 83.8 fL (ref 80.0–100.0)
Monocytes Absolute: 0.2 10*3/uL (ref 0.1–1.0)
Monocytes Relative: 7 %
Neutro Abs: 2.2 10*3/uL (ref 1.7–7.7)
Neutrophils Relative %: 75 %
Platelets: 210 10*3/uL (ref 150–400)
RBC: 4.69 MIL/uL (ref 3.87–5.11)
RDW: 13.8 % (ref 11.5–15.5)
WBC: 3 10*3/uL — ABNORMAL LOW (ref 4.0–10.5)
nRBC: 0 % (ref 0.0–0.2)

## 2018-11-28 LAB — CBC
HCT: 40 % (ref 36.0–46.0)
Hemoglobin: 12.9 g/dL (ref 12.0–15.0)
MCH: 27.7 pg (ref 26.0–34.0)
MCHC: 32.3 g/dL (ref 30.0–36.0)
MCV: 86 fL (ref 80.0–100.0)
Platelets: 179 10*3/uL (ref 150–400)
RBC: 4.65 MIL/uL (ref 3.87–5.11)
RDW: 14 % (ref 11.5–15.5)
WBC: 3 10*3/uL — ABNORMAL LOW (ref 4.0–10.5)
nRBC: 0.7 % — ABNORMAL HIGH (ref 0.0–0.2)

## 2018-11-28 LAB — HEMOGLOBIN A1C
Hgb A1c MFr Bld: 6.1 % — ABNORMAL HIGH (ref 4.8–5.6)
Mean Plasma Glucose: 128.37 mg/dL

## 2018-11-28 LAB — ABO/RH: ABO/RH(D): O POS

## 2018-11-28 LAB — GLUCOSE, CAPILLARY: Glucose-Capillary: 149 mg/dL — ABNORMAL HIGH (ref 70–99)

## 2018-11-28 MED ORDER — ENSURE ENLIVE PO LIQD
237.0000 mL | Freq: Two times a day (BID) | ORAL | Status: DC
  Administered 2018-11-29 – 2018-12-02 (×6): 237 mL via ORAL

## 2018-11-28 MED ORDER — TOCILIZUMAB 400 MG/20ML IV SOLN
800.0000 mg | Freq: Once | INTRAVENOUS | Status: AC
  Administered 2018-11-28: 800 mg via INTRAVENOUS
  Filled 2018-11-28: qty 40

## 2018-11-28 MED ORDER — GUAIFENESIN 100 MG/5ML PO SOLN
5.0000 mL | Freq: Four times a day (QID) | ORAL | Status: DC | PRN
  Administered 2018-11-28 – 2018-12-02 (×6): 100 mg via ORAL
  Filled 2018-11-28 (×7): qty 15

## 2018-11-28 MED ORDER — SODIUM CHLORIDE 0.9% IV SOLUTION
Freq: Once | INTRAVENOUS | Status: AC
  Administered 2018-11-28: via INTRAVENOUS

## 2018-11-28 NOTE — Progress Notes (Signed)
Pt reports she has not been taking her home medications Adderall . She would like it discontinued will notified MD

## 2018-11-28 NOTE — Progress Notes (Signed)
PROGRESS NOTE                                                                                                                                                                                                             Patient Demographics:    Heather Randall, is a 58 y.o. female, DOB - 12/17/1960, WUJ:[id number]  Admit date - 11/27/2018   Admitting Physician Merlene Laughtermair Latif Sheikh, DO  Outpatient Primary MD for the patient is Johny BlamerHarris, William, MD  LOS - 1   Chief Complaint  Patient presents with   COVID   Shortness of Breath       Brief Narrative    Female with past medical history of osteoarthritis, hypertension, ADD, patient was tested on 6/25 for COVID-19, results came back on 6/30 positive  for COVID-19, she presents with dyspnea on 11/27/2018, transferred to [facility] for further care   Subjective:    Heather Randall today ports some cough, dyspnea, nausea, vomiting or chest pain .    Assessment  & Plan :    Active Problems:   Hypertensive disorder   Pneumonia due to COVID-19 virus   Acute respiratory failure with hypoxia (HCC)   COVID-19 virus infection   Acute respiratory disease due to COVID-19 virus  Acute Hypoxic respiratory failure secondary to COVID-19 pneumonia -Patient currently on 8 L nasal cannula, even with that her O2 sat sometimes still dropped to 88% on speaking and minimal movement, he is high risk for deterioration and intubation. -CRP elevated at 15, LDH elevated at 463, written within normal limit, will continue to trend inflammatory markers daily -Procalcitonin within normal limit, no indication for antibiotics -Given significant oxygen requirement, x-ray findings, and inflammatory markers, patient is high risk for deterioration, the already started on IV steroids, and Remdesivir, despite that she had increase of her oxygen requirement from 6 to 8 L nasal cannula, cussed with her Actemra, and convulsant plasma, patient is agreeable to  both after long discussion and detailed explanations, she will receive Actemra today, and give convulsant plasma if available.  The treatment plan and use of medications and known side effects were discussed with patient/family, they were clearly explained that there is no proven definitive treatment for COVID-19 infection, any medications used here are based on published clinical articles/anecdotal data which are not peer-reviewed or randomized control trials.  Complete risks and long-term side effects are unknown, however in the best clinical judgment they seem to be of some clinical benefit rather than medical risks.  Patient/family agree with the treatment plan and want to receive the given medications.   COVID-19 Labs  Recent Labs    11/27/18 1128  DDIMER 1.13*  FERRITIN 231  LDH 463*  CRP 15.0*    No results found for: SARSCOV2NAA  Hypertension  - currently Blood pessure is acceptable, continue with Norvasc, will hold Avapro and hydrochlorothiazide  ADD - continue with home meds  Code Status : Full Code  Family Communication  : D/W patient  Disposition Plan  : pending further work up.  Barriers For Discharge : Is on 8 L nasal cannula, on IV steroids, Remdesivir, Actemra, require convulsant plasma as well  Consults  :  None  Procedures  : None  DVT Prophylaxis  :  [place] lovenox  Lab Results  Component Value Date   PLT 179 11/28/2018    Antibiotics  :    Anti-infectives (From admission, onward)   Start     Dose/Rate Route Frequency Ordered Stop   11/28/18 2000  remdesivir 100 mg in sodium chloride 0.9 % 250 mL IVPB     100 mg 500 mL/hr over 30 Minutes Intravenous Every 24 hours 11/27/18 1910 12/02/18 1959   11/27/18 2000  remdesivir 200 mg in sodium chloride 0.9 % 250 mL IVPB     200 mg 500 mL/hr over 30 Minutes Intravenous Once 11/27/18 1910 11/28/18 0813        Objective:   Vitals:   11/28/18 0500 11/28/18 0745 11/28/18 0800 11/28/18 1022  BP:  125/84   116/85  Pulse: 76     Resp: (!) 33     Temp:  98.5 F (36.9 C)    TempSrc:  Oral    SpO2: 90%  90%   Weight: 105.2 kg     Height:        Wt Readings from Last 3 Encounters:  11/28/18 105.2 kg     Intake/Output Summary (Last 24 hours) at 11/28/2018 1159 Last data filed at 11/28/2018 0513 Gross per 24 hour  Intake 263.1 ml  Output --  Net 263.1 ml     Physical Exam  Awake Alert, Oriented X 3, No new F.N deficits, Normal affect Symmetrical Chest wall movement, managed air entry at the bases, with some rails, wheezing, with some tacypnea RRR,No Gallops,Rubs or new Murmurs, No Parasternal Heave +ve B.Sounds, Abd Soft, No tenderness, No rebound - guarding or rigidity. No Cyanosis, Clubbing or edema, No new Rash or bruise      Data Review:    CBC Recent Labs  Lab 11/27/18 1128 11/28/18 0150  WBC 4.0 3.0*  HGB 14.1 12.9  HCT 42.2 40.0  PLT 181 179  MCV 85.1 86.0  MCH 28.4 27.7  MCHC 33.4 32.3  RDW 13.7 14.0  LYMPHSABS 0.5*  --   MONOABS 0.3  --   EOSABS 0.0  --   BASOSABS 0.0  --     Chemistries  Recent Labs  Lab 11/27/18 1128 11/28/18 0150  NA 134* 136  K 3.9 4.8  CL 98 100  CO2 26 26  GLUCOSE 95 161*  BUN 18 20  CREATININE 1.16* 1.26*  CALCIUM 8.9 8.7*  AST 37 32  ALT 9 8  ALKPHOS 95 84  BILITOT 0.4 0.3   ------------------------------------------------------------------------------------------------------------------ Recent Labs    11/27/18 1128  TRIG 114  Lab Results  Component Value Date   HGBA1C 6.1 (H) 11/28/2018   ------------------------------------------------------------------------------------------------------------------ Recent Labs    11/27/18 1100  TSH 0.989   ------------------------------------------------------------------------------------------------------------------ Recent Labs    11/27/18 1128  FERRITIN 231    Coagulation profile No results for input(s): INR, PROTIME in the last 168 hours.  Recent Labs     11/27/18 1128  DDIMER 1.13*    Cardiac Enzymes No results for input(s): CKMB, TROPONINI, MYOGLOBIN in the last 168 hours.  Invalid input(s): CK ------------------------------------------------------------------------------------------------------------------ No results found for: BNP  Inpatient Medications  Scheduled Meds:  sodium chloride   Intravenous Once   amLODipine  5 mg Oral Daily   amphetamine-dextroamphetamine  30 mg Oral BH-q7a   enoxaparin (LOVENOX) injection  40 mg Subcutaneous Q24H   fluticasone furoate-vilanterol  1 puff Inhalation Daily   guaiFENesin  600 mg Oral BID   Ipratropium-Albuterol  1 puff Inhalation Q6H   mouth rinse  15 mL Mouth Rinse BID   methylPREDNISolone (SOLU-MEDROL) injection  60 mg Intravenous Q8H   Continuous Infusions:  sodium chloride 250 mL (11/28/18 1018)   remdesivir 100 mg in NS 250 mL     PRN Meds:.sodium chloride, acetaminophen **OR** acetaminophen, bisacodyl, guaiFENesin, ondansetron **OR** ondansetron (ZOFRAN) IV, polyethylene glycol  Micro Results Recent Results (from the past 240 hour(s))  Blood Culture (routine x 2)     Status: None (Preliminary result)   Collection Time: 11/27/18 11:28 AM   Specimen: BLOOD  Result Value Ref Range Status   Specimen Description   Final    BLOOD BLOOD LEFT FOREARM Performed at [facility], [address] [place], [place] [address]    Special Requests   Final    BOTTLES DRAWN AEROBIC AND ANAEROBIC Blood Culture results may not be optimal due to an excessive volume of blood received in culture bottles Performed at [facility], [address] [place], [place] [address]    Culture   Final    NO GROWTH < 24 HOURS Performed at [facility], [address] [place], [place] [address]    Report Status PENDING  Incomplete  Blood Culture (routine x 2)     Status: None (Preliminary result)   Collection Time: 11/27/18 11:49 AM    Specimen: BLOOD  Result Value Ref Range Status   Specimen Description   Final    BLOOD BLOOD LEFT FOREARM UPPER Performed at [facility], [address] [place], [place] [address]    Special Requests   Final    BOTTLES DRAWN AEROBIC AND ANAEROBIC Blood Culture results may not be optimal due to an excessive volume of blood received in culture bottles Performed at [facility], [address] [place], [place] [address]    Culture   Final    NO GROWTH < 24 HOURS Performed at [facility], [address] [place], [place] [address]    Report Status PENDING  Incomplete    Radiology Reports Portable Chest 1 View  Result Date: 11/28/2018 CLINICAL DATA:  Short of breath EXAM: PORTABLE CHEST 1 VIEW COMPARISON:  11/27/2018 FINDINGS: Progression of bilateral airspace disease with basilar predominance. No pleural effusion. Heart size mildly enlarged. No acute skeletal abnormality. IMPRESSION: Progression of bilateral airspace disease with basilar predominance. Electronically Signed   By: Marlan Palauharles  Clark M.D.   On: 11/28/2018 08:10   Dg Chest Port 1 View  Result Date: 11/27/2018 CLINICAL DATA:  Cough, shortness of breath. EXAM: PORTABLE  CHEST 1 VIEW COMPARISON:  None. FINDINGS: The heart size and mediastinal contours are within normal limits. No pneumothorax or pleural effusion is noted. Bilateral patchy airspace opacities are noted in the perihilar and basilar regions bilaterally, right greater than left. The visualized skeletal structures are unremarkable. IMPRESSION: Findings consistent with multifocal pneumonia. Electronically Signed   By: Lupita RaiderJames  Green Jr M.D.   On: 11/27/2018 12:54    Time Spent in minutes  35 minutes   Huey Bienenstockawood Adlynn Lowenstein M.D on 11/28/2018 at 11:59 AM  Between 7am to 7pm - Pager - [phone]  After 7pm go to [email or web address] - password [email or web address]  [facility] -  Office  [phone]

## 2018-11-28 NOTE — Progress Notes (Addendum)
Pt changed to supine to eat. sats decreased 85% increased to 12 L HFNC  1415 encouraged patient to lay prone. Patient refused and states she wanted to continue to lay on left side for now since her sats were 92%. RN explained had to increase O2 to maintain sat and she would lay prone 2-3 hours at a time.  Placed patient back on 9L HFNC will continue to monitor.  1854 pt prone for 3 hours. Turned supine to eat sats down 85% pt states back hurts from pronging. Placed on non-rebreather.  Will continue to monitor.  1857 sats 79% patient agreered to lay on right side. Increased to 90 % will continue to monitor.

## 2018-11-28 NOTE — Procedures (Signed)
I, Phillips Climes, MD, consented Subject Heather Randall (female, Date of Birth 12-19-1960, 58 y.o.) and with diagnosis of COVID-19, in the [facility] Expanded Access Program (EAP) Research Protocol for [employer] against COVID-19.  The consent took place under following circumstances.   Subject Capacity assessed by this investigator as:  Presence of adequate emotional and mental capacity to consent with normal ability to read and write.  Consent took place in the following setting(s):  In-room, face to face   The following were present for the consent process:  Investigator   A copy of the cover letter and signed consent document was provided to subject/LAR.  The original signed consent document has been placed in the subject's physical chart and will be scanned into the electronic medical record upon discharge.  Statement of acknowledgement that the following was discussed with the subject/LAR:    1) Discussed the purpose of the research and procedures  2) Discussed risks and benefits and uncertainties of study participation 3) Discussed subject's responsibilities  4) Discussed the measures in place to maintain subject's confidentiality while a participant on the trial  5) Discussed alternatives to study participation.   6) Discussed study participation is voluntary and that the subject's care would not be jeopardized if they declined participation in the study.   7) Discussed freedom to withdraw at any time.   8) All subject/LAR questions were answered to their satisfaction.   9) In case of emergency consent, investigator agreed to discuss with subject/LAR at earliest available opportunity when the subject stabilizes and/or LAR can be located.     Final Investigator Signature  [place], [place] [phone]   Date: 11/28/2018 and 11:41 AM

## 2018-11-28 NOTE — Progress Notes (Signed)
Initial Nutrition Assessment  DOCUMENTATION CODES:   Obesity unspecified  INTERVENTION:   Recommend liberalize diet to Regular, po intake 50%. Pt de-sats with activity. - sent message to MD  Ensure Enlive po BID, each supplement provides 350 kcal and 20 grams of protein  Pt receiving Hormel Shake daily with Breakfast which provides 520 kcals and 22 g of protein and Magic cup BID with lunch and dinner, each supplement provides 290 kcal and 9 grams of protein, automatically on meal trays to optimize nutritional intake.    NUTRITION DIAGNOSIS:   Increased nutrient needs related to acute illness(COVID-19) as evidenced by estimated needs.    GOAL:   Patient will meet greater than or equal to 90% of their needs    MONITOR:   PO intake, Supplement acceptance  REASON FOR ASSESSMENT:   Consult Assessment of nutrition requirement/status  ASSESSMENT:   Pt with PMH of OA and HTN who tested positive for COVID-19 on 6/25 is now admitted (7/1) for dyspnea.   Pt currently requiring 12 L via HFNC. Per charting pt de-sats with activity. She is being asked to prone.   Medications reviewed and include: solumedrol  Labs reviewed: Lab Results  Component Value Date   HGBA1C 6.1 (H) 11/28/2018    NUTRITION - FOCUSED PHYSICAL EXAM:  Deferred, RD working remotely   Diet Order:   Diet Order            Diet Heart Room service appropriate? Yes; Fluid consistency: Thin  Diet effective now              EDUCATION NEEDS:   No education needs have been identified at this time  Skin:  Skin Assessment: Reviewed RN Assessment  Last BM:  7/1  Height:   Ht Readings from Last 1 Encounters:  11/27/18 5\' 9"  (1.753 m)    Weight:   Wt Readings from Last 1 Encounters:  11/28/18 105.2 kg    Ideal Body Weight:  65.9 kg  BMI:  Body mass index is 34.25 kg/m.  Estimated Nutritional Needs:   Kcal:  2000-2300  Protein:  100-115 grams  Fluid:  > 2 L/day  Maylon Peppers RD,  LDN, CNSC [phone] Pager [phone] After Hours Pager

## 2018-11-29 LAB — GLUCOSE, CAPILLARY
Glucose-Capillary: 145 mg/dL — ABNORMAL HIGH (ref 70–99)
Glucose-Capillary: 172 mg/dL — ABNORMAL HIGH (ref 70–99)
Glucose-Capillary: 197 mg/dL — ABNORMAL HIGH (ref 70–99)

## 2018-11-29 LAB — COMPREHENSIVE METABOLIC PANEL
ALT: 9 U/L (ref 0–44)
AST: 26 U/L (ref 15–41)
Albumin: 3.4 g/dL — ABNORMAL LOW (ref 3.5–5.0)
Alkaline Phosphatase: 76 U/L (ref 38–126)
Anion gap: 10 (ref 5–15)
BUN: 29 mg/dL — ABNORMAL HIGH (ref 6–20)
CO2: 27 mmol/L (ref 22–32)
Calcium: 8.6 mg/dL — ABNORMAL LOW (ref 8.9–10.3)
Chloride: 101 mmol/L (ref 98–111)
Creatinine, Ser: 1.21 mg/dL — ABNORMAL HIGH (ref 0.44–1.00)
GFR calc Af Amer: 58 mL/min — ABNORMAL LOW (ref >60)
GFR calc non Af Amer: 50 mL/min — ABNORMAL LOW (ref >60)
Glucose, Bld: 153 mg/dL — ABNORMAL HIGH (ref 70–99)
Potassium: 4.5 mmol/L (ref 3.5–5.1)
Sodium: 138 mmol/L (ref 135–145)
Total Bilirubin: 0.5 mg/dL (ref 0.3–1.2)
Total Protein: 7.3 g/dL (ref 6.5–8.1)

## 2018-11-29 LAB — HIV ANTIBODY (ROUTINE TESTING W REFLEX): HIV Screen 4th Generation wRfx: NONREACTIVE

## 2018-11-29 LAB — FERRITIN: Ferritin: 207 ng/mL (ref 11–307)

## 2018-11-29 LAB — C-REACTIVE PROTEIN: CRP: 7.2 mg/dL — ABNORMAL HIGH (ref <1.0)

## 2018-11-29 LAB — D-DIMER, QUANTITATIVE: D-Dimer, Quant: 0.64 ug/mL-FEU — ABNORMAL HIGH (ref 0.00–0.50)

## 2018-11-29 NOTE — Progress Notes (Signed)
PROGRESS NOTE                                                                                                                                                                                                             Patient Demographics:    Heather Randall, is a 58 y.o. female, DOB - 10/24/1960, [id number]  Admit date - 11/27/2018   Admitting Physician Merlene Laughtermair Latif Sheikh, DO  Outpatient Primary MD for the patient is Johny BlamerHarris, William, MD  LOS - 2   Chief Complaint  Patient presents with  . COVID  . Shortness of Breath       Brief Narrative    Female with past medical history of osteoarthritis, hypertension, ADD, patient was tested on 6/25 for COVID-19, results came back on 6/30 positive  for COVID-19, she presents with dyspnea on 11/27/2018, [facility]   Subjective:    Heather Randall today reports some cough, some dyspnea, denies any chest pain.   Assessment  & Plan :    Active Problems:   Hypertensive disorder   Pneumonia due to COVID-19 virus   Acute respiratory failure with hypoxia (HCC)   COVID-19 virus infection   Acute respiratory disease due to COVID-19 virus  Acute Hypoxic respiratory failure secondary to COVID-19 pneumonia -Patient on 8 L high flow nasal cannula yesterday, was able to wean down to 6 L high flow nasal cannula, she was encouraged to use incentive spirometry, and to prone  -RP, and ferritin are trending down which is reassuring. -Give Actemra 11/28/2018 -CVID convulsant plasma earlier this morning 11/29/2018 -Continue with IV Solu-Medrol -Continue with IV Remdesivir -Procalcitonin within normal limit, no indication for antibiotics  The treatment plan and use of medications and known side effects were discussed with patient/family, they were clearly explained that there is no proven definitive treatment for COVID-19 infection, any medications used here are based on published clinical  articles/anecdotal data which are not peer-reviewed or randomized control trials.  Complete risks and long-term side effects are unknown, however in the best clinical judgment they seem to be of some clinical benefit rather than medical risks.  Patient/family agree with the treatment plan and want to receive the given medications.   COVID-19 Labs  Recent Labs    11/27/18 1128 11/29/18 0438  DDIMER 1.13* 0.64*  FERRITIN 231 207  LDH 463*  --   CRP 15.0* 7.2*    No results found for: SARSCOV2NAA  Hypertension  - currently Blood pessure is acceptable, continue with Norvasc, will hold Avapro and hydrochlorothiazide   Code Status : Full Code  Family Communication  : D/W patient  Disposition Plan  : pending further work up.  Consults  :  None  Procedures  : None  DVT Prophylaxis  :  [place] lovenox  Lab Results  Component Value Date   PLT 210 11/28/2018    Antibiotics  :    Anti-infectives (From admission, onward)   Start     Dose/Rate Route Frequency Ordered Stop   11/28/18 2000  remdesivir 100 mg in sodium chloride 0.9 % 250 mL IVPB     100 mg 500 mL/hr over 30 Minutes Intravenous Every 24 hours 11/27/18 1910 12/02/18 1959   11/27/18 2000  remdesivir 200 mg in sodium chloride 0.9 % 250 mL IVPB     200 mg 500 mL/hr over 30 Minutes Intravenous Once 11/27/18 1910 11/28/18 0813        Objective:   Vitals:   11/29/18 0700 11/29/18 0820 11/29/18 0950 11/29/18 1000  BP:  118/80    Pulse: (!) 56  78   Resp: 19  (!) 31   Temp:  97.9 F (36.6 C)    TempSrc:      SpO2: 97%  92% 95%  Weight:      Height:        Wt Readings from Last 3 Encounters:  11/29/18 105.5 kg     Intake/Output Summary (Last 24 hours) at 11/29/2018 1223 Last data filed at 11/29/2018 0700 Gross per 24 hour  Intake 1073.11 ml  Output -  Net 1073.11 ml     Physical Exam  Awake Alert, Oriented X 3, No new F.N deficits, Normal affect Symmetrical Chest wall movement, Good air movement  bilaterally, has scattered rales. RRR,No Gallops,Rubs or new Murmurs, No Parasternal Heave +ve B.Sounds, Abd Soft, No tenderness, No rebound - guarding or rigidity. No Cyanosis, Clubbing or edema, No new Rash or bruise       Data Review:    CBC Recent Labs  Lab 11/27/18 1128 11/28/18 0150 11/28/18 1300  WBC 4.0 3.0* 3.0*  HGB 14.1 12.9 13.1  HCT 42.2 40.0 39.3  PLT 181 179 210  MCV 85.1 86.0 83.8  MCH 28.4 27.7 27.9  MCHC 33.4 32.3 33.3  RDW 13.7 14.0 13.8  LYMPHSABS 0.5*  --  0.5*  MONOABS 0.3  --  0.2  EOSABS 0.0  --  0.0  BASOSABS 0.0  --  0.0    Chemistries  Recent Labs  Lab 11/27/18 1128 11/28/18 0150 11/29/18 0438  NA 134* 136 138  K 3.9 4.8 4.5  CL 98 100 101  CO2 26 26 27   GLUCOSE 95 161* 153*  BUN 18 20 29*  CREATININE 1.16* 1.26* 1.21*  CALCIUM 8.9 8.7* 8.6*  AST 37 32 26  ALT 9 8 9   ALKPHOS 95 84 76  BILITOT 0.4 0.3 0.5   ------------------------------------------------------------------------------------------------------------------ Recent Labs    11/27/18 1128  TRIG 114    Lab Results  Component Value Date   HGBA1C 6.1 (H) 11/28/2018   ------------------------------------------------------------------------------------------------------------------ Recent Labs    11/27/18 1100  TSH 0.989   ------------------------------------------------------------------------------------------------------------------ Recent Labs    11/27/18 1128 11/29/18 0438  FERRITIN 231 207    Coagulation profile No results for input(s): INR, PROTIME in the last 168 hours.  Recent Labs    11/27/18 1128 11/29/18 0438  DDIMER 1.13* 0.64*    Cardiac Enzymes No results for input(s): CKMB, TROPONINI, MYOGLOBIN in the last 168 hours.  Invalid input(s): CK ------------------------------------------------------------------------------------------------------------------ No results found for: BNP  Inpatient Medications  Scheduled Meds: .  amLODipine  5 mg Oral Daily  . enoxaparin (LOVENOX) injection  40 mg Subcutaneous Q24H  . feeding supplement (ENSURE ENLIVE)  237 mL Oral BID BM  . fluticasone furoate-vilanterol  1 puff Inhalation Daily  . guaiFENesin  600 mg Oral BID  . Ipratropium-Albuterol  1 puff Inhalation Q6H  . mouth rinse  15 mL Mouth Rinse BID  . methylPREDNISolone (SOLU-MEDROL) injection  60 mg Intravenous Q8H   Continuous Infusions: . sodium chloride Stopped (11/28/18 1500)  . remdesivir 100 mg in NS 250 mL Stopped (11/28/18 2100)   PRN Meds:.sodium chloride, acetaminophen **OR** acetaminophen, bisacodyl, guaiFENesin, ondansetron **OR** ondansetron (ZOFRAN) IV, polyethylene glycol  Micro Results Recent Results (from the past 240 hour(s))  Blood Culture (routine x 2)     Status: None (Preliminary result)   Collection Time: 11/27/18 11:28 AM   Specimen: BLOOD  Result Value Ref Range Status   Specimen Description   Final    BLOOD BLOOD LEFT FOREARM Performed at [facility], [address] [place], [place] [address]    Special Requests   Final    BOTTLES DRAWN AEROBIC AND ANAEROBIC Blood Culture results may not be optimal due to an excessive volume of blood received in culture bottles Performed at [facility], [address] [place], [place] [address]    Culture   Final    NO GROWTH 2 DAYS Performed at [facility], [address] [place], [place] [address]    Report Status PENDING  Incomplete  Blood Culture (routine x 2)     Status: None (Preliminary result)   Collection Time: 11/27/18 11:49 AM   Specimen: BLOOD  Result Value Ref Range Status   Specimen Description   Final    BLOOD BLOOD LEFT FOREARM UPPER Performed at [facility], [address] [place], [place] [address]    Special Requests   Final    BOTTLES DRAWN AEROBIC AND ANAEROBIC Blood Culture results may not be optimal due to an excessive volume of blood received in  culture bottles Performed at [facility], [address] [place], [place] [address]    Culture   Final    NO GROWTH 2 DAYS Performed at [facility], [address] [place], [place] [address]    Report Status PENDING  Incomplete    Radiology Reports Portable Chest 1 View  Result Date: 11/28/2018 CLINICAL DATA:  Short of breath EXAM: PORTABLE CHEST 1 VIEW COMPARISON:  11/27/2018 FINDINGS: Progression of bilateral airspace disease with basilar predominance. No pleural effusion. Heart size mildly enlarged. No acute skeletal abnormality. IMPRESSION: Progression of bilateral airspace disease with basilar predominance. Electronically Signed   By: Marlan Palauharles  Clark M.D.   On: 11/28/2018 08:10   Dg Chest Port 1 View  Result Date: 11/27/2018 CLINICAL DATA:  Cough, shortness of breath. EXAM: PORTABLE CHEST 1 VIEW COMPARISON:  None. FINDINGS: The heart size and mediastinal contours are within normal limits. No pneumothorax or pleural effusion is noted. Bilateral patchy airspace opacities are noted in the perihilar and basilar regions bilaterally, right greater than left. The visualized skeletal structures are unremarkable. IMPRESSION: Findings consistent with multifocal pneumonia. Electronically Signed   By:  Lupita RaiderJames  Green Jr M.D.   On: 11/27/2018 12:54    Time Spent in minutes  35 minutes   Huey Bienenstockawood  M.D on 11/29/2018 at 12:23 PM  Between 7am to 7pm - Pager - [phone]  After 7pm go to [email or web address] - password [email or web address]  [facility] -  Office  [phone]

## 2018-11-29 NOTE — Care Management (Signed)
Case manager will continue to monitor patient for appropriate discharge plan as she medically improves. May God bless her to do so.    Jennavie Martinek, RN BSN Case Manager [phone] 

## 2018-11-29 NOTE — Progress Notes (Signed)
Placed O2 monitor on pts forehead and it seems to be providing more accurate readings. Weaned her O2 to 6L and she is sating 95% at rest. When ambulating to the bathroom, pt desated into the 80s but recovered very quickly while remaining on 6L. Will continue to monitor.

## 2018-11-30 LAB — COMPREHENSIVE METABOLIC PANEL
ALT: 12 U/L (ref 0–44)
AST: 34 U/L (ref 15–41)
Albumin: 3.2 g/dL — ABNORMAL LOW (ref 3.5–5.0)
Alkaline Phosphatase: 74 U/L (ref 38–126)
Anion gap: 13 (ref 5–15)
BUN: 32 mg/dL — ABNORMAL HIGH (ref 6–20)
CO2: 26 mmol/L (ref 22–32)
Calcium: 8.1 mg/dL — ABNORMAL LOW (ref 8.9–10.3)
Chloride: 99 mmol/L (ref 98–111)
Creatinine, Ser: 1.11 mg/dL — ABNORMAL HIGH (ref 0.44–1.00)
GFR calc Af Amer: >60 mL/min (ref >60)
GFR calc non Af Amer: 55 mL/min — ABNORMAL LOW (ref >60)
Glucose, Bld: 141 mg/dL — ABNORMAL HIGH (ref 70–99)
Potassium: 4.7 mmol/L (ref 3.5–5.1)
Sodium: 138 mmol/L (ref 135–145)
Total Bilirubin: 0.1 mg/dL — ABNORMAL LOW (ref 0.3–1.2)
Total Protein: 6.4 g/dL — ABNORMAL LOW (ref 6.5–8.1)

## 2018-11-30 LAB — PREPARE FRESH FROZEN PLASMA: Unit division: 0

## 2018-11-30 LAB — CBC
HCT: 39.1 % (ref 36.0–46.0)
Hemoglobin: 12.4 g/dL (ref 12.0–15.0)
MCH: 28 pg (ref 26.0–34.0)
MCHC: 31.7 g/dL (ref 30.0–36.0)
MCV: 88.3 fL (ref 80.0–100.0)
Platelets: 298 10*3/uL (ref 150–400)
RBC: 4.43 MIL/uL (ref 3.87–5.11)
RDW: 14.3 % (ref 11.5–15.5)
WBC: 11 10*3/uL — ABNORMAL HIGH (ref 4.0–10.5)
nRBC: 0 % (ref 0.0–0.2)

## 2018-11-30 LAB — C-REACTIVE PROTEIN: CRP: 3.7 mg/dL — ABNORMAL HIGH (ref <1.0)

## 2018-11-30 LAB — BPAM FFP
Blood Product Expiration Date: 202007032100
ISSUE DATE / TIME: 202007022213
Unit Type and Rh: 9500

## 2018-11-30 LAB — D-DIMER, QUANTITATIVE: D-Dimer, Quant: 0.55 ug/mL-FEU — ABNORMAL HIGH (ref 0.00–0.50)

## 2018-11-30 LAB — FERRITIN: Ferritin: 156 ng/mL (ref 11–307)

## 2018-11-30 LAB — GLUCOSE, CAPILLARY: Glucose-Capillary: 149 mg/dL — ABNORMAL HIGH (ref 70–99)

## 2018-11-30 MED ORDER — IPRATROPIUM-ALBUTEROL 20-100 MCG/ACT IN AERS
1.0000 | INHALATION_SPRAY | Freq: Three times a day (TID) | RESPIRATORY_TRACT | Status: DC
  Administered 2018-11-30 – 2018-12-02 (×6): 1 via RESPIRATORY_TRACT

## 2018-11-30 MED ORDER — SENNOSIDES-DOCUSATE SODIUM 8.6-50 MG PO TABS
3.0000 | ORAL_TABLET | Freq: Two times a day (BID) | ORAL | Status: DC
  Administered 2018-11-30: 3 via ORAL
  Filled 2018-11-30 (×3): qty 3

## 2018-11-30 NOTE — Progress Notes (Signed)
PROGRESS NOTE                                                                                                                                                                                                             Patient Demographics:    Heather Randall, is a 58 y.o. female, DOB - 08/06/1960, [id number]  Admit date - 11/27/2018   Admitting Physician Merlene Laughtermair Latif Sheikh, DO  Outpatient Primary MD for the patient is Johny BlamerHarris, William, MD  LOS - 3   Chief Complaint  Patient presents with   COVID   Shortness of Breath       Brief Narrative    Female with past medical history of osteoarthritis, hypertension, ADD, patient was tested on 6/25 for COVID-19, results came back on 6/30 positive  for COVID-19, she presents with dyspnea on 11/27/2018, [facility]   Subjective:    Heather BariMary Randall today reports she is feeling better today, had a good night sleep, no bowel movement  Assessment  & Plan :    Active Problems:   Hypertensive disorder   Pneumonia due to COVID-19 virus   Acute respiratory failure with hypoxia (HCC)   COVID-19 virus infection   Acute respiratory disease due to COVID-19 virus  Acute Hypoxic respiratory failure secondary to COVID-19 pneumonia -[place]iley on 8 L high flow nasal cannula, she was weaned this morning to 4 L nasal cannula . -Patient to prone and use incentive spirometry  -Telemetry markers trending down which is reassuring -Received Actemra 11/28/2018 -Received convulsant plasma  11/29/2018 -Continue with IV Solu-Medrol -Continue with IV Remdesivir -Procalcitonin within normal limit, no indication for antibiotics  The treatment plan and use of medications and known side effects were discussed with patient/family, they were clearly explained that there is no proven definitive treatment for COVID-19 infection, any medications used here are based on published clinical articles/anecdotal data which are not  peer-reviewed or randomized control trials.  Complete risks and long-term side effects are unknown, however in the best clinical judgment they seem to be of some clinical benefit rather than medical risks.  Patient/family agree with the treatment plan and want to receive the given medications.   COVID-19 Labs  Recent Labs    11/29/18 0438 11/30/18 0150  DDIMER 0.64* 0.55*  FERRITIN 207 156  CRP 7.2* 3.7*  No results found for: SARSCOV2NAA  Hypertension  - currently Blood pessure is acceptable, continue with Norvasc, will hold Avapro and hydrochlorothiazide   Code Status : Full Code  Family Communication  : updating husband daily  Disposition Plan  : pending further work up.  Consults  :  None  Procedures  : None  DVT Prophylaxis  :  [place] lovenox  Lab Results  Component Value Date   PLT 298 11/30/2018    Antibiotics  :    Anti-infectives (From admission, onward)   Start     Dose/Rate Route Frequency Ordered Stop   11/28/18 2000  remdesivir 100 mg in sodium chloride 0.9 % 250 mL IVPB     100 mg 500 mL/hr over 30 Minutes Intravenous Every 24 hours 11/27/18 1910 12/02/18 1959   11/27/18 2000  remdesivir 200 mg in sodium chloride 0.9 % 250 mL IVPB     200 mg 500 mL/hr over 30 Minutes Intravenous Once 11/27/18 1910 11/28/18 0813        Objective:   Vitals:   11/29/18 1614 11/30/18 0323 11/30/18 0813 11/30/18 0915  BP:   (!) 125/93   Pulse:   75   Resp:      Temp: 98.3 F (36.8 C)  98.6 F (37 C)   TempSrc: Oral  Oral   SpO2: 97%  100% 93%  Weight:  102.7 kg    Height:        Wt Readings from Last 3 Encounters:  11/30/18 102.7 kg     Intake/Output Summary (Last 24 hours) at 11/30/2018 1415 Last data filed at 11/30/2018 0600 Gross per 24 hour  Intake 314.75 ml  Output --  Net 314.75 ml     Physical Exam  Awake Alert, Oriented X 3, No new F.N deficits, Normal affect Symmetrical Chest wall movement, Good air movement bilaterally, has scattered  rales RRR,No Gallops,Rubs or new Murmurs, No Parasternal Heave +ve B.Sounds, Abd Soft, No tenderness, No rebound - guarding or rigidity. No Cyanosis, Clubbing or edema, No new Rash or bruise        Data Review:    CBC Recent Labs  Lab 11/27/18 1128 11/28/18 0150 11/28/18 1300 11/30/18 0500  WBC 4.0 3.0* 3.0* 11.0*  HGB 14.1 12.9 13.1 12.4  HCT 42.2 40.0 39.3 39.1  PLT 181 179 210 298  MCV 85.1 86.0 83.8 88.3  MCH 28.4 27.7 27.9 28.0  MCHC 33.4 32.3 33.3 31.7  RDW 13.7 14.0 13.8 14.3  LYMPHSABS 0.5*  --  0.5*  --   MONOABS 0.3  --  0.2  --   EOSABS 0.0  --  0.0  --   BASOSABS 0.0  --  0.0  --     Chemistries  Recent Labs  Lab 11/27/18 1128 11/28/18 0150 11/29/18 0438 11/30/18 0150  NA 134* 136 138 138  K 3.9 4.8 4.5 4.7  CL 98 100 101 99  CO2 26 26 27 26   GLUCOSE 95 161* 153* 141*  BUN 18 20 29* 32*  CREATININE 1.16* 1.26* 1.21* 1.11*  CALCIUM 8.9 8.7* 8.6* 8.1*  AST 37 32 26 34  ALT 9 8 9 12   ALKPHOS 95 84 76 74  BILITOT 0.4 0.3 0.5 0.1*   ------------------------------------------------------------------------------------------------------------------ No results for input(s): CHOL, HDL, LDLCALC, TRIG, CHOLHDL, LDLDIRECT in the last 72 hours.  Lab Results  Component Value Date   HGBA1C 6.1 (H) 11/28/2018   ------------------------------------------------------------------------------------------------------------------ No results for input(s): TSH, T4TOTAL, T3FREE, THYROIDAB in the last  72 hours.  Invalid input(s): FREET3 ------------------------------------------------------------------------------------------------------------------ Recent Labs    11/29/18 0438 11/30/18 0150  FERRITIN 207 156    Coagulation profile No results for input(s): INR, PROTIME in the last 168 hours.  Recent Labs    11/29/18 0438 11/30/18 0150  DDIMER 0.64* 0.55*    Cardiac Enzymes No results for input(s): CKMB, TROPONINI, MYOGLOBIN in the last 168  hours.  Invalid input(s): CK ------------------------------------------------------------------------------------------------------------------ No results found for: BNP  Inpatient Medications  Scheduled Meds:  amLODipine  5 mg Oral Daily   enoxaparin (LOVENOX) injection  40 mg Subcutaneous Q24H   feeding supplement (ENSURE ENLIVE)  237 mL Oral BID BM   fluticasone furoate-vilanterol  1 puff Inhalation Daily   guaiFENesin  600 mg Oral BID   Ipratropium-Albuterol  1 puff Inhalation TID   mouth rinse  15 mL Mouth Rinse BID   methylPREDNISolone (SOLU-MEDROL) injection  60 mg Intravenous Q8H   Continuous Infusions:  sodium chloride Stopped (11/28/18 1500)   remdesivir 100 mg in NS 250 mL Stopped (11/29/18 2023)   PRN Meds:.sodium chloride, acetaminophen **OR** acetaminophen, bisacodyl, guaiFENesin, ondansetron **OR** ondansetron (ZOFRAN) IV, polyethylene glycol  Micro Results Recent Results (from the past 240 hour(s))  Blood Culture (routine x 2)     Status: None (Preliminary result)   Collection Time: 11/27/18 11:28 AM   Specimen: BLOOD  Result Value Ref Range Status   Specimen Description   Final    BLOOD BLOOD LEFT FOREARM Performed at [facility], [place] [address], [place], [place] [address]    Special Requests   Final    BOTTLES DRAWN AEROBIC AND ANAEROBIC Blood Culture results may not be optimal due to an excessive volume of blood received in culture bottles Performed at [place] [address], [place], [place] [address]    Culture   Final    NO GROWTH 2 DAYS Performed at [place] [address], [place], [place] [address]    Report Status PENDING  Incomplete  Blood Culture (routine x 2)     Status: None (Preliminary result)   Collection Time: 11/27/18 11:49 AM   Specimen: BLOOD  Result Value Ref Range Status   Specimen Description   Final    BLOOD BLOOD LEFT FOREARM UPPER Performed at [place] [address] [place], [place] [address]    Special Requests   Final    BOTTLES DRAWN AEROBIC AND ANAEROBIC Blood Culture results may not be optimal due to an excessive volume of blood received in culture bottles Performed at [place] [address] [place], [place] [address]    Culture   Final    NO GROWTH 2 DAYS Performed at [place] [address], [place], [place] [address]    Report Status PENDING  Incomplete    Radiology Reports Portable Chest 1 View  Result Date: 11/28/2018 CLINICAL DATA:  Short of breath EXAM: PORTABLE CHEST 1 VIEW COMPARISON:  11/27/2018 FINDINGS: Progression of bilateral airspace disease with basilar predominance. No pleural effusion. Heart size mildly enlarged. No acute skeletal abnormality. IMPRESSION: Progression of bilateral airspace disease with basilar predominance. Electronically Signed   By: Franchot Gallo M.D.   On: 11/28/2018 08:10   Dg Chest Port 1 View  Result Date: 11/27/2018 CLINICAL DATA:  Cough, shortness of breath. EXAM: PORTABLE CHEST 1 VIEW COMPARISON:  None. FINDINGS: The heart size and mediastinal contours are within normal limits. No pneumothorax or pleural  effusion is noted. Bilateral patchy airspace opacities are noted in the perihilar and basilar regions bilaterally, right greater than left. The visualized skeletal structures are unremarkable. IMPRESSION: Findings consistent with multifocal pneumonia. Electronically Signed   By: Lupita RaiderJames  Green Jr M.D.   On: 11/27/2018 12:54    Time Spent in minutes  25 minutes   Huey Bienenstockawood Carrell Rahmani M.D on 11/30/2018 at 2:15 PM  Between 7am to 7pm - Pager - [phone]  After 7pm go to [email or web address] - password [email or web address]  [facility] -  Office  [phone]

## 2018-11-30 NOTE — Progress Notes (Signed)
Ambulated with pt in hallway, pt sats on 4L [place] at rest 94%, with ambulation pt needed to increase to 6L as sats were 84%, on 6L pt reached 91%. Walked 2 laps around 1/2 the floor loop. At Rest pt was on 4L and sats were 94% and recovery was less than 2 minutes.

## 2018-12-01 LAB — CBC
HCT: 38.5 % (ref 36.0–46.0)
Hemoglobin: 12.2 g/dL (ref 12.0–15.0)
MCH: 26.9 pg (ref 26.0–34.0)
MCHC: 31.7 g/dL (ref 30.0–36.0)
MCV: 84.8 fL (ref 80.0–100.0)
Platelets: 316 K/uL (ref 150–400)
RBC: 4.54 MIL/uL (ref 3.87–5.11)
RDW: 13.7 % (ref 11.5–15.5)
WBC: 10 K/uL (ref 4.0–10.5)
nRBC: 0 % (ref 0.0–0.2)

## 2018-12-01 LAB — COMPREHENSIVE METABOLIC PANEL WITH GFR
ALT: 19 U/L (ref 0–44)
AST: 35 U/L (ref 15–41)
Albumin: 3.2 g/dL — ABNORMAL LOW (ref 3.5–5.0)
Alkaline Phosphatase: 70 U/L (ref 38–126)
Anion gap: 8 (ref 5–15)
BUN: 31 mg/dL — ABNORMAL HIGH (ref 6–20)
CO2: 27 mmol/L (ref 22–32)
Calcium: 8.3 mg/dL — ABNORMAL LOW (ref 8.9–10.3)
Chloride: 104 mmol/L (ref 98–111)
Creatinine, Ser: 1.12 mg/dL — ABNORMAL HIGH (ref 0.44–1.00)
GFR calc Af Amer: >60 mL/min (ref >60)
GFR calc non Af Amer: 55 mL/min — ABNORMAL LOW (ref >60)
Glucose, Bld: 158 mg/dL — ABNORMAL HIGH (ref 70–99)
Potassium: 4.1 mmol/L (ref 3.5–5.1)
Sodium: 139 mmol/L (ref 135–145)
Total Bilirubin: 0.4 mg/dL (ref 0.3–1.2)
Total Protein: 6.9 g/dL (ref 6.5–8.1)

## 2018-12-01 LAB — C-REACTIVE PROTEIN: CRP: 1.7 mg/dL — ABNORMAL HIGH (ref <1.0)

## 2018-12-01 LAB — FERRITIN: Ferritin: 129 ng/mL (ref 11–307)

## 2018-12-01 LAB — D-DIMER, QUANTITATIVE: D-Dimer, Quant: 0.96 ug{FEU}/mL — ABNORMAL HIGH (ref 0.00–0.50)

## 2018-12-01 LAB — GLUCOSE, CAPILLARY: Glucose-Capillary: 154 mg/dL — ABNORMAL HIGH (ref 70–99)

## 2018-12-01 MED ORDER — METHYLPREDNISOLONE SODIUM SUCC 40 MG IJ SOLR
40.0000 mg | Freq: Every day | INTRAMUSCULAR | Status: DC
  Administered 2018-12-02: 40 mg via INTRAVENOUS
  Filled 2018-12-01: qty 1

## 2018-12-01 NOTE — Progress Notes (Signed)
PROGRESS NOTE                                                                                                                                                                                                             Patient Demographics:    Heather Randall, is a 58 y.o. female, DOB - 03/01/1961, VHQ:[id number]  Admit date - 11/27/2018   Admitting Physician Merlene Laughtermair Latif Sheikh, DO  Outpatient Primary MD for the patient is Johny BlamerHarris, William, MD  LOS - 4   Chief Complaint  Patient presents with  . COVID  . Shortness of Breath       Brief Narrative    Female with past medical history of osteoarthritis, hypertension, ADD, patient was tested on 6/25 for COVID-19, results came back on 6/30 positive  for COVID-19, she presents with dyspnea on 11/27/2018, [facility] care   Subjective:    Heather BariMary Albano today reports her dyspnea has improved, No chest pain.   Assessment  & Plan :    Active Problems:   Hypertensive disorder   Pneumonia due to COVID-19 virus   Acute respiratory failure with hypoxia (HCC)   COVID-19 virus infection   Acute respiratory disease due to COVID-19 virus  Acute Hypoxic respiratory failure secondary to COVID-19 pneumonia -Inially on 8 L high flow nasal cannula, she is on 3 L nasal cannula this morning . -Patient was encouraged to prone and use incentive spirometry  -Inflammatory markers trending down which is reassuring -Received Actemra 11/28/2018 -Received convulsant plasma  11/29/2018 -Continue with IV Solu-Medrol, will start tapering -Continue with IV Remdesivir -Procalcitonin within normal limit, no indication for antibiotics  The treatment plan and use of medications and known side effects were discussed with patient/family, they were clearly explained that there is no proven definitive treatment for COVID-19 infection, any medications used here are based on published clinical articles/anecdotal data which are  not peer-reviewed or randomized control trials.  Complete risks and long-term side effects are unknown, however in the best clinical judgment they seem to be of some clinical benefit rather than medical risks.  Patient/family agree with the treatment plan and want to receive the given medications.   COVID-19 Labs  Recent Labs    11/29/18 0438 11/30/18 0150 12/01/18 0530  DDIMER 0.64* 0.55* 0.96*  FERRITIN 207 156 129  CRP  7.2* 3.7* 1.7*    No results found for: SARSCOV2NAA  Hypertension  - currently Blood pessure is acceptable, continue with Norvasc, will hold Avapro and hydrochlorothiazide   Code Status : Full Code  Family Communication  : updating husband daily  Disposition Plan  : pending further work up.  Consults  :  None  Procedures  : None  DVT Prophylaxis  :  [address] lovenox  Lab Results  Component Value Date   PLT 316 12/01/2018    Antibiotics  :    Anti-infectives (From admission, onward)   Start     Dose/Rate Route Frequency Ordered Stop   11/28/18 2000  remdesivir 100 mg in sodium chloride 0.9 % 250 mL IVPB     100 mg 500 mL/hr over 30 Minutes Intravenous Every 24 hours 11/27/18 1910 12/02/18 1959   11/27/18 2000  remdesivir 200 mg in sodium chloride 0.9 % 250 mL IVPB     200 mg 500 mL/hr over 30 Minutes Intravenous Once 11/27/18 1910 11/28/18 0813        Objective:   Vitals:   11/30/18 1945 12/01/18 0445 12/01/18 0755 12/01/18 1105  BP: 136/90 115/69 132/85   Pulse: 66 60 82 99  Resp: 20 18 20  (!) 28  Temp: 98.6 F (37 C) 98.1 F (36.7 C) 97.8 F (36.6 C)   TempSrc: Oral Oral Oral   SpO2: 91% 92% 91% 94%  Weight:      Height:        Wt Readings from Last 3 Encounters:  11/30/18 102.7 kg     Intake/Output Summary (Last 24 hours) at 12/01/2018 1405 Last data filed at 12/01/2018 0830 Gross per 24 hour  Intake 1080 ml  Output 2 ml  Net 1078 ml     Physical Exam  Awake Alert, Oriented X 3, No new F.N deficits, Normal affect  Symmetrical Chest wall movement, Good air movement bilaterally, her lungs are clear today RRR,No Gallops,Rubs or new Murmurs, No Parasternal Heave +ve B.Sounds, Abd Soft, No tenderness, No rebound - guarding or rigidity. No Cyanosis, Clubbing or edema, No new Rash or bruise         Data Review:    CBC Recent Labs  Lab 11/27/18 1128 11/28/18 0150 11/28/18 1300 11/30/18 0500 12/01/18 0530  WBC 4.0 3.0* 3.0* 11.0* 10.0  HGB 14.1 12.9 13.1 12.4 12.2  HCT 42.2 40.0 39.3 39.1 38.5  PLT 181 179 210 298 316  MCV 85.1 86.0 83.8 88.3 84.8  MCH 28.4 27.7 27.9 28.0 26.9  MCHC 33.4 32.3 33.3 31.7 31.7  RDW 13.7 14.0 13.8 14.3 13.7  LYMPHSABS 0.5*  --  0.5*  --   --   MONOABS 0.3  --  0.2  --   --   EOSABS 0.0  --  0.0  --   --   BASOSABS 0.0  --  0.0  --   --     Chemistries  Recent Labs  Lab 11/27/18 1128 11/28/18 0150 11/29/18 0438 11/30/18 0150 12/01/18 0530  NA 134* 136 138 138 139  K 3.9 4.8 4.5 4.7 4.1  CL 98 100 101 99 104  CO2 26 26 27 26 27   GLUCOSE 95 161* 153* 141* 158*  BUN 18 20 29* 32* 31*  CREATININE 1.16* 1.26* 1.21* 1.11* 1.12*  CALCIUM 8.9 8.7* 8.6* 8.1* 8.3*  AST 37 32 26 34 35  ALT 9 8 9 12 19   ALKPHOS 95 84 76 74 70  BILITOT 0.4 0.3  0.5 0.1* 0.4   ------------------------------------------------------------------------------------------------------------------ No results for input(s): CHOL, HDL, LDLCALC, TRIG, CHOLHDL, LDLDIRECT in the last 72 hours.  Lab Results  Component Value Date   HGBA1C 6.1 (H) 11/28/2018   ------------------------------------------------------------------------------------------------------------------ No results for input(s): TSH, T4TOTAL, T3FREE, THYROIDAB in the last 72 hours.  Invalid input(s): FREET3 ------------------------------------------------------------------------------------------------------------------ Recent Labs    11/30/18 0150 12/01/18 0530  FERRITIN 156 129    Coagulation profile No  results for input(s): INR, PROTIME in the last 168 hours.  Recent Labs    11/30/18 0150 12/01/18 0530  DDIMER 0.55* 0.96*    Cardiac Enzymes No results for input(s): CKMB, TROPONINI, MYOGLOBIN in the last 168 hours.  Invalid input(s): CK ------------------------------------------------------------------------------------------------------------------ No results found for: BNP  Inpatient Medications  Scheduled Meds: . amLODipine  5 mg Oral Daily  . enoxaparin (LOVENOX) injection  40 mg Subcutaneous Q24H  . feeding supplement (ENSURE ENLIVE)  237 mL Oral BID BM  . fluticasone furoate-vilanterol  1 puff Inhalation Daily  . guaiFENesin  600 mg Oral BID  . Ipratropium-Albuterol  1 puff Inhalation TID  . mouth rinse  15 mL Mouth Rinse BID  . methylPREDNISolone (SOLU-MEDROL) injection  60 mg Intravenous Q8H  . senna-docusate  3 tablet Oral BID   Continuous Infusions: . sodium chloride Stopped (11/28/18 1500)  . remdesivir 100 mg in NS 250 mL 100 mg (11/30/18 2124)   PRN Meds:.sodium chloride, acetaminophen **OR** acetaminophen, bisacodyl, guaiFENesin, ondansetron **OR** ondansetron (ZOFRAN) IV, polyethylene glycol  Micro Results Recent Results (from the past 240 hour(s))  Blood Culture (routine x 2)     Status: None (Preliminary result)   Collection Time: 11/27/18 11:28 AM   Specimen: BLOOD  Result Value Ref Range Status   Specimen Description   Final    BLOOD BLOOD LEFT FOREARM Performed at [facility], [address] [place], [place] [address]    Special Requests   Final    BOTTLES DRAWN AEROBIC AND ANAEROBIC Blood Culture results may not be optimal due to an excessive volume of blood received in culture bottles Performed at [facility], [address] [place], [place] [address]    Culture   Final    NO GROWTH 3 DAYS Performed at [facility], [address] [place], [place] [address]    Report Status PENDING  Incomplete   Blood Culture (routine x 2)     Status: None (Preliminary result)   Collection Time: 11/27/18 11:49 AM   Specimen: BLOOD  Result Value Ref Range Status   Specimen Description   Final    BLOOD BLOOD LEFT FOREARM UPPER Performed at [facility], [address] [place], [place] [address]    Special Requests   Final    BOTTLES DRAWN AEROBIC AND ANAEROBIC Blood Culture results may not be optimal due to an excessive volume of blood received in culture bottles Performed at [facility], [address] [place], [place] [address]    Culture   Final    NO GROWTH 3 DAYS Performed at [facility], [address] [place], [place] [address]    Report Status PENDING  Incomplete    Radiology Reports Portable Chest 1 View  Result Date: 11/28/2018 CLINICAL DATA:  Short of breath EXAM: PORTABLE CHEST 1 VIEW COMPARISON:  11/27/2018 FINDINGS: Progression of bilateral airspace disease with basilar predominance. No pleural effusion. Heart size mildly enlarged. No acute skeletal abnormality. IMPRESSION: Progression of bilateral airspace disease with  basilar predominance. Electronically Signed   By: Franchot Gallo M.D.   On: 11/28/2018 08:10   Dg Chest Port 1 View  Result Date: 11/27/2018 CLINICAL DATA:  Cough, shortness of breath. EXAM: PORTABLE CHEST 1 VIEW COMPARISON:  None. FINDINGS: The heart size and mediastinal contours are within normal limits. No pneumothorax or pleural effusion is noted. Bilateral patchy airspace opacities are noted in the perihilar and basilar regions bilaterally, right greater than left. The visualized skeletal structures are unremarkable. IMPRESSION: Findings consistent with multifocal pneumonia. Electronically Signed   By: Marijo Conception M.D.   On: 11/27/2018 12:54    Time Spent in minutes  25 minutes   Phillips Climes M.D on 12/01/2018 at 2:05 PM  Between 7am to 7pm - Pager - [phone]  After 7pm go to [email or web address] -  password [facility]  [facility] -  Office  [phone]

## 2018-12-01 NOTE — Plan of Care (Signed)
  Problem: Education: Goal: Knowledge of risk factors and measures for prevention of condition will improve Outcome: Progressing   Problem: Coping: Goal: Psychosocial and spiritual needs will be supported Outcome: Progressing   Problem: Respiratory: Goal: Will maintain a patent airway Outcome: Progressing Goal: Complications related to the disease process, condition or treatment will be avoided or minimized Outcome: Progressing   

## 2018-12-02 LAB — CBC
HCT: 35.5 % — ABNORMAL LOW (ref 36.0–46.0)
Hemoglobin: 11.1 g/dL — ABNORMAL LOW (ref 12.0–15.0)
MCH: 26.9 pg (ref 26.0–34.0)
MCHC: 31.3 g/dL (ref 30.0–36.0)
MCV: 86.2 fL (ref 80.0–100.0)
Platelets: 293 10*3/uL (ref 150–400)
RBC: 4.12 MIL/uL (ref 3.87–5.11)
RDW: 14 % (ref 11.5–15.5)
WBC: 7.9 10*3/uL (ref 4.0–10.5)
nRBC: 0 % (ref 0.0–0.2)

## 2018-12-02 LAB — COMPREHENSIVE METABOLIC PANEL
ALT: 13 U/L (ref 0–44)
AST: 23 U/L (ref 15–41)
Albumin: 2.9 g/dL — ABNORMAL LOW (ref 3.5–5.0)
Alkaline Phosphatase: 59 U/L (ref 38–126)
Anion gap: 7 (ref 5–15)
BUN: 28 mg/dL — ABNORMAL HIGH (ref 6–20)
CO2: 27 mmol/L (ref 22–32)
Calcium: 7.8 mg/dL — ABNORMAL LOW (ref 8.9–10.3)
Chloride: 106 mmol/L (ref 98–111)
Creatinine, Ser: 1.15 mg/dL — ABNORMAL HIGH (ref 0.44–1.00)
GFR calc Af Amer: >60 mL/min (ref >60)
GFR calc non Af Amer: 53 mL/min — ABNORMAL LOW (ref >60)
Glucose, Bld: 91 mg/dL (ref 70–99)
Potassium: 3.7 mmol/L (ref 3.5–5.1)
Sodium: 140 mmol/L (ref 135–145)
Total Bilirubin: <0.1 mg/dL — ABNORMAL LOW (ref 0.3–1.2)
Total Protein: 5.7 g/dL — ABNORMAL LOW (ref 6.5–8.1)

## 2018-12-02 LAB — FERRITIN: Ferritin: 132 ng/mL (ref 11–307)

## 2018-12-02 LAB — CULTURE, BLOOD (ROUTINE X 2)
Culture: NO GROWTH
Culture: NO GROWTH

## 2018-12-02 LAB — C-REACTIVE PROTEIN: CRP: 1 mg/dL — ABNORMAL HIGH (ref <1.0)

## 2018-12-02 LAB — D-DIMER, QUANTITATIVE: D-Dimer, Quant: 1.28 ug/mL-FEU — ABNORMAL HIGH (ref 0.00–0.50)

## 2018-12-02 MED ORDER — ACETAMINOPHEN 325 MG PO TABS: 650.0000 mg | ORAL_TABLET | Freq: Four times a day (QID) | ORAL | Status: AC | PRN

## 2018-12-02 MED ORDER — DEXAMETHASONE 6 MG PO TABS: 6.0000 mg | ORAL_TABLET | Freq: Every day | ORAL | 0 refills | Status: DC

## 2018-12-02 NOTE — Discharge Summary (Signed)
Heather Randall, is a 58 y.o. female  DOB 07/05/1960  MRN [id number].  Admission date:  11/27/2018  Admitting Physician  Merlene Laughtermair Latif Sheikh, DO  Discharge Date:  12/02/2018   Primary MD  Johny BlamerHarris, William, MD  Recommendations for primary care physician for things to follow:  - please check CBC, CMP during next visit.   Admission Diagnosis  Shortness of breath [R06.02] Acute respiratory disease due to COVID-19 virus [U07.1, J06.9] Pneumonia due to COVID-19 virus [U07.1, J12.89]   Discharge Diagnosis  Shortness of breath [R06.02] Acute respiratory disease due to COVID-19 virus [U07.1, J06.9] Pneumonia due to COVID-19 virus [U07.1, J12.89]    Active Problems:   Hypertensive disorder   Pneumonia due to COVID-19 virus   Acute respiratory failure with hypoxia (HCC)   COVID-19 virus infection   Acute respiratory disease due to COVID-19 virus      Past Medical History:  Diagnosis Date   Hypertension    Osteoarthritis     Past Surgical History:  Procedure Laterality Date   Right Knee Arthroscopy  2016       History of present illness and  Hospital Course:     Kindly see H&P for history of present illness and admission details, please review complete Labs, Consult reports and Test reports for all details in brief  HPI  from the history and physical done on the day of admission 11/27/2018  HPI: Heather Randall is a 58 y.o. female with medical history significant of of osteoarthritis, hypertension, ADD and other comorbidities who presents with chief complaint of worsening shortness of breath that started last Wednesday associated with nonproductive cough and fevers and chills.  Patient has been recently exposed to COVID-19 disease by her son who came home for Father's Day and he was ill at that time.  They started developing symptoms last Wednesday and was tested and test results came back yesterday and  she was positive.  Patient had worsening shortness of breath on exertion as well as with rest because of her cough and fever she presented to the ED for further evaluation.  She denies any chest pain, lightheadedness or dizziness but does feel fatigued and states that she has been extremely short of breath.  States that she is never felt like this before.  [facility] was called admit this patient as she is COVID-19 disease positive associated with a pneumonia.  [facility]  ED Course: In the ED she was worked up and given acetaminophen and placed on supplemental oxygen.  She had basic blood work done as well as a chest x-ray which showed multifocal pneumonia.  Of note she tested positive for COVID-19 disease yesterday and results were called to her yesterday.  Hospital Course  Female with past medical history of osteoarthritis, hypertension, ADD, patient was tested on 6/25 for COVID-19, results came back on 6/30 positive  for COVID-19, she presents with dyspnea on 11/27/2018, [facility] care     Acute Hypoxic respiratory  failure secondary to COVID-19 pneumonia -Inially on 8 L high flow nasal cannula, she weaned to room air today, ambulated in the room with no oxygen requirement or hypoxia -Inflammatory markers trending down which is reassuring -Received Actemra 11/28/2018 -Received convulsant plasma  11/29/2018 -With IV Solu-Medrol during hospital stay, she will be discharged on another 5 days of Decadron -Treated with IV Remdesivir x5 days -Procalcitonin within normal limit, no indication for antibiotics  The treatment plan and use of medications and known side effects were discussed with patient/family, they were clearly explained that there is no proven definitive treatment for COVID-19 infection, any medications used here are based on published clinical articles/anecdotal data which are not peer-reviewed or randomized control trials.  Complete risks  and long-term side effects are unknown, however in the best clinical judgment they seem to be of some clinical benefit rather than medical risks.  Patient/family agree with the treatment plan and want to receive the given medications.   COVID-19 Labs   Recent Labs    11/30/18 0150 12/01/18 0530 12/02/18 0433  DDIMER 0.55* 0.96* 1.28*  FERRITIN 156 129 132  CRP 3.7* 1.7* 1.0*    No results found for: SARSCOV2NAA         Hypertension  - currently Blood pessure is acceptable, continue with Norvasc, will hold Avapro and hydrochlorothiazide on discharge.   Discharge Condition:   Follow UP  Follow-up Information    Johny BlamerHarris, William, MD.   Specialty: Family Medicine Contact information: [address] [place] [address] [phone]             Discharge Instructions  and  Discharge Medications   Discharge Instructions    Discharge instructions   Complete by: As directed    Follow with Primary MD Johny BlamerHarris, William, MD in 7 days   Get CBC, CMP, 2 view Chest X ray checked  by Primary MD next visit.    Activity: As tolerated with Full fall precautions use walker/cane & assistance as needed   Disposition Home    Diet: Heart Healthy , with feeding assistance and aspiration precautions.  For Heart failure patients - Check your Weight same time everyday, if you gain over 2 pounds, or you develop in leg swelling, experience more shortness of breath or chest pain, call your Primary MD immediately. Follow Cardiac Low Salt Diet and 1.5 lit/day fluid restriction.   On your next visit with your primary care physician please Get Medicines reviewed and adjusted.   Please request your Prim.MD to go over all Hospital Tests and Procedure/Radiological results at the follow up, please get all Hospital records sent to your Prim MD by signing hospital release before you go home.   If you experience worsening of your admission symptoms, develop shortness  of breath, life threatening emergency, suicidal or homicidal thoughts you must seek medical attention immediately by calling 911 or calling your MD immediately  if symptoms less severe.  You Must read complete instructions/literature along with all the possible adverse reactions/side effects for all the Medicines you take and that have been prescribed to you. Take any new Medicines after you have completely understood and accpet all the possible adverse reactions/side effects.   Do not drive, operating heavy machinery, perform activities at heights, swimming or participation in water activities or provide baby sitting services if your were admitted for syncope or siezures until you have seen by Primary MD or a Neurologist and advised to do so again.  Do not drive when  taking Pain medications.    Do not take more than prescribed Pain, Sleep and Anxiety Medications  Special Instructions: If you have smoked or chewed Tobacco  in the last 2 yrs please stop smoking, stop any regular Alcohol  and or any Recreational drug use.  Wear Seat belts while driving.   Please note  You were cared for by a hospitalist during your hospital stay. If you have any questions about your discharge medications or the care you received while you were in the hospital after you are discharged, you can call the unit and asked to speak with the hospitalist on call if the hospitalist that took care of you is not available. Once you are discharged, your primary care physician will handle any further medical issues. Please note that NO REFILLS for any discharge medications will be authorized once you are discharged, as it is imperative that you return to your primary care physician (or establish a relationship with a primary care physician if you do not have one) for your aftercare needs so that they can reassess your need for medications and monitor your lab values.   Increase activity slowly   Complete by: As directed       Allergies as of 12/02/2018      Reactions   Catapres [clonidine Hcl]    Pt stated, "It burns my skin"      Medication List    STOP taking these medications   Adderall XR 30 MG 24 hr capsule Generic drug: amphetamine-dextroamphetamine   diclofenac 75 MG EC tablet Commonly known as: VOLTAREN   hydrochlorothiazide 12.5 MG tablet Commonly known as: HYDRODIURIL   irbesartan 300 MG tablet Commonly known as: AVAPRO     TAKE these medications   acetaminophen 325 MG tablet Commonly known as: TYLENOL Take 2 tablets (650 mg total) by mouth every 6 (six) hours as needed for mild pain (or Fever >/= 101).   amLODipine 5 MG tablet Commonly known as: NORVASC Take 5 mg by mouth daily.   Breo Ellipta 100-25 MCG/INH Aepb Generic drug: fluticasone furoate-vilanterol Inhale 1 puff into the lungs daily.   dexamethasone 6 MG tablet Commonly known as: DECADRON Take 1 tablet (6 mg total) by mouth daily. Start taking on: December 03, 2018         Diet and Activity recommendation: See Discharge Instructions above   Consults obtained -  None   Major procedures and Radiology Reports - PLEASE review detailed and final reports for all details, in brief -     Portable Chest 1 View  Result Date: 11/28/2018 CLINICAL DATA:  Short of breath EXAM: PORTABLE CHEST 1 VIEW COMPARISON:  11/27/2018 FINDINGS: Progression of bilateral airspace disease with basilar predominance. No pleural effusion. Heart size mildly enlarged. No acute skeletal abnormality. IMPRESSION: Progression of bilateral airspace disease with basilar predominance. Electronically Signed   By: Marlan Palauharles  Clark M.D.   On: 11/28/2018 08:10   Dg Chest Port 1 View  Result Date: 11/27/2018 CLINICAL DATA:  Cough, shortness of breath. EXAM: PORTABLE CHEST 1 VIEW COMPARISON:  None. FINDINGS: The heart size and mediastinal contours are within normal limits. No pneumothorax or pleural effusion is noted. Bilateral patchy airspace opacities are  noted in the perihilar and basilar regions bilaterally, right greater than left. The visualized skeletal structures are unremarkable. IMPRESSION: Findings consistent with multifocal pneumonia. Electronically Signed   By: Lupita RaiderJames  Green Jr M.D.   On: 11/27/2018 12:54    Micro Results     Recent Results (  from the past 240 hour(s))  Blood Culture (routine x 2)     Status: None (Preliminary result)   Collection Time: 11/27/18 11:28 AM   Specimen: BLOOD  Result Value Ref Range Status   Specimen Description   Final    BLOOD BLOOD LEFT FOREARM Performed at [facility] [address] [place], [place] [address]    Special Requests   Final    BOTTLES DRAWN AEROBIC AND ANAEROBIC Blood Culture results may not be optimal due to an excessive volume of blood received in culture bottles Performed at [facility] [address], [place], [place] [address]    Culture   Final    NO GROWTH 4 DAYS Performed at [facility], [place] [address] [place], [place] [address]    Report Status PENDING  Incomplete  Blood Culture (routine x 2)     Status: None (Preliminary result)   Collection Time: 11/27/18 11:49 AM   Specimen: BLOOD  Result Value Ref Range Status   Specimen Description   Final    BLOOD BLOOD LEFT FOREARM UPPER Performed at [place] [address] [place], [place] [address]    Special Requests   Final    BOTTLES DRAWN AEROBIC AND ANAEROBIC Blood Culture results may not be optimal due to an excessive volume of blood received in culture bottles Performed at [facility] [address] [place], [place] [address]    Culture   Final    NO GROWTH 4 DAYS Performed at [facility], [place] [address], [place], [place] [address]    Report Status PENDING  Incomplete       Today   Subjective:   Heather Randall today has no headache,no chest or abdominal pain, feels much better wants to go home today.    Objective:   Blood pressure 113/70, pulse 67, temperature 98.5 F (36.9 C), temperature source Oral, resp. rate 19, height 5\' 9"  (1.753 m), weight 102.7 kg, last menstrual period 05/29/2012, SpO2 92 %.   Intake/Output Summary (Last 24 hours) at 12/02/2018 1056 Last data filed at 12/02/2018 0000 Gross per 24 hour  Intake 209 ml  Output --  Net 209 ml    Exam Awake Alert, Oriented x 3, No new F.N deficits, Normal affect Symmetrical Chest wall movement, Good air movement bilaterally, CTAB RRR,No Gallops,Rubs or new Murmurs, No Parasternal Heave +ve B.Sounds, Abd Soft,  No rebound -guarding or rigidity. No Cyanosis, Clubbing or edema, No new Rash or bruise  Data Review   CBC w Diff:  Lab Results  Component Value Date   WBC 7.9 12/02/2018   HGB 11.1 (L) 12/02/2018   HCT 35.5 (L) 12/02/2018   PLT 293 12/02/2018   LYMPHOPCT 17 11/28/2018   MONOPCT 7 11/28/2018   EOSPCT 0 11/28/2018   BASOPCT 0 11/28/2018    CMP:  Lab Results  Component Value Date   NA 140 12/02/2018   K 3.7 12/02/2018   CL 106 12/02/2018   CO2 27 12/02/2018   BUN 28 (H) 12/02/2018   CREATININE 1.15 (H) 12/02/2018   PROT 5.7 (L) 12/02/2018   ALBUMIN 2.9 (L) 12/02/2018   BILITOT <0.1 (L) 12/02/2018   ALKPHOS 59 12/02/2018   AST 23 12/02/2018   ALT 13 12/02/2018  .   Total Time in preparing paper work, data evaluation and todays exam - 17 minutes  Phillips Climes M.D on 12/02/2018 at 10:56 AM  [facility]  Office  [phone]

## 2018-12-02 NOTE — Progress Notes (Signed)
Patient given discharge instructions, and verbalized an understanding of all discharge instructions.  Patient agrees with discharge plan, and is being discharged in stable medical condition.  Patient to be given transportation via wheelchair.  

## 2018-12-02 NOTE — Progress Notes (Signed)
Patient ambulated in the room with oximeter on her ear lobe.  Oxygen saturation remained above 95 percent.

## 2018-12-02 NOTE — Discharge Instructions (Signed)
Person Under Monitoring Name: Heather Randall  Location: [place] [address]   Infection Prevention Recommendations for Individuals Confirmed to have, or Being Evaluated for, 2019 Novel Coronavirus (COVID-19) Infection Who Receive Care at Home  Individuals who are confirmed to have, or are being evaluated for, COVID-19 should follow the prevention steps below until a healthcare provider or local or state health department says they can return to normal activities.  Stay home except to get medical care You should restrict activities outside your home, except for getting medical care. Do not go to work, school, or public areas, and do not use public transportation or taxis.  Call ahead before visiting your doctor Before your medical appointment, call the healthcare provider and tell them that you have, or are being evaluated for, COVID-19 infection. This will help the healthcare providers office take steps to keep other people from getting infected. Ask your healthcare provider to call the local or state health department.  Monitor your symptoms Seek prompt medical attention if your illness is worsening (e.g., difficulty breathing). Before going to your medical appointment, call the healthcare provider and tell them that you have, or are being evaluated for, COVID-19 infection. Ask your healthcare provider to call the local or state health department.  Wear a facemask You should wear a facemask that covers your nose and mouth when you are in the same room with other people and when you visit a healthcare provider. People who live with or visit you should also wear a facemask while they are in the same room with you.  Separate yourself from other people in your home As much as possible, you should stay in a different room from other people in your home. Also, you should use a separate bathroom, if available.  Avoid sharing household items You should not  share dishes, drinking glasses, cups, eating utensils, towels, bedding, or other items with other people in your home. After using these items, you should wash them thoroughly with soap and water.  Cover your coughs and sneezes Cover your mouth and nose with a tissue when you cough or sneeze, or you can cough or sneeze into your sleeve. Throw used tissues in a lined trash can, and immediately wash your hands with soap and water for at least 20 seconds or use an alcohol-based hand rub.  Wash your [facility] your hands often and thoroughly with soap and water for at least 20 seconds. You can use an alcohol-based hand sanitizer if soap and water are not available and if your hands are not visibly dirty. Avoid touching your eyes, nose, and mouth with unwashed hands.   Prevention Steps for Caregivers and Household Members of Individuals Confirmed to have, or Being Evaluated for, COVID-19 Infection Being Cared for in the Home  If you live with, or provide care at home for, a person confirmed to have, or being evaluated for, COVID-19 infection please follow these guidelines to prevent infection:  Follow healthcare providers instructions Make sure that you understand and can help the patient follow any healthcare provider instructions for all care.  Provide for the patients basic needs You should help the patient with basic needs in the home and provide support for getting groceries, prescriptions, and other personal needs.  Monitor the patients symptoms If they are getting sicker, call his or her medical provider and tell them that the patient has, or is being evaluated for, COVID-19 infection. This will help the healthcare providers  office take steps to keep other people from getting infected. Ask the healthcare provider to call the local or state health department.  Limit the number of people who have contact with the patient  If possible, have only one caregiver for the  patient.  Other household members should stay in another home or place of residence. If this is not possible, they should stay  in another room, or be separated from the patient as much as possible. Use a separate bathroom, if available.  Restrict visitors who do not have an essential need to be in the home.  Keep older adults, very young children, and other sick people away from the patient Keep older adults, very young children, and those who have compromised immune systems or chronic health conditions away from the patient. This includes people with chronic heart, lung, or kidney conditions, diabetes, and cancer.  Ensure good ventilation Make sure that shared spaces in the home have good air flow, such as from an air conditioner or an opened window, weather permitting.  Wash your hands often  Wash your hands often and thoroughly with soap and water for at least 20 seconds. You can use an alcohol based hand sanitizer if soap and water are not available and if your hands are not visibly dirty.  Avoid touching your eyes, nose, and mouth with unwashed hands.  Use disposable paper towels to dry your hands. If not available, use dedicated cloth towels and replace them when they become wet.  Wear a facemask and gloves  Wear a disposable facemask at all times in the room and gloves when you touch or have contact with the patients blood, body fluids, and/or secretions or excretions, such as sweat, saliva, sputum, nasal mucus, vomit, urine, or feces.  Ensure the mask fits over your nose and mouth tightly, and do not touch it during use.  Throw out disposable facemasks and gloves after using them. Do not reuse.  Wash your hands immediately after removing your facemask and gloves.  If your personal clothing becomes contaminated, carefully remove clothing and launder. Wash your hands after handling contaminated clothing.  Place all used disposable facemasks, gloves, and other waste in a lined  container before disposing them with other household waste.  Remove gloves and wash your hands immediately after handling these items.  Do not share dishes, glasses, or other household items with the patient  Avoid sharing household items. You should not share dishes, drinking glasses, cups, eating utensils, towels, bedding, or other items with a patient who is confirmed to have, or being evaluated for, COVID-19 infection.  After the person uses these items, you should wash them thoroughly with soap and water.  Wash laundry thoroughly  Immediately remove and wash clothes or bedding that have blood, body fluids, and/or secretions or excretions, such as sweat, saliva, sputum, nasal mucus, vomit, urine, or feces, on them.  Wear gloves when handling laundry from the patient.  Read and follow directions on labels of laundry or clothing items and detergent. In general, wash and dry with the warmest temperatures recommended on the label.  Clean all areas the individual has used often  Clean all touchable surfaces, such as counters, tabletops, doorknobs, bathroom fixtures, toilets, phones, keyboards, tablets, and bedside tables, every day. Also, clean any surfaces that may have blood, body fluids, and/or secretions or excretions on them.  Wear gloves when cleaning surfaces the patient has come in contact with.  Use a diluted bleach solution (e.g., dilute bleach with 1  part bleach and 10 parts water) or a household disinfectant with a label that says EPA-registered for coronaviruses. To make a bleach solution at home, add 1 tablespoon of bleach to 1 quart (4 cups) of water. For a larger supply, add  cup of bleach to 1 gallon (16 cups) of water.  Read labels of cleaning products and follow recommendations provided on product labels. Labels contain instructions for safe and effective use of the cleaning product including precautions you should take when applying the product, such as wearing gloves or  eye protection and making sure you have good ventilation during use of the product.  Remove gloves and wash hands immediately after cleaning.  Monitor yourself for signs and symptoms of illness Caregivers and household members are considered close contacts, should monitor their health, and will be asked to limit movement outside of the home to the extent possible. Follow the monitoring steps for close contacts listed on the symptom monitoring form.   ? If you have additional questions, contact your local health department or call the epidemiologist on call at [phone] (available 24/7). ? This guidance is subject to change. For the most up-to-date guidance from [facility], please refer to their website: [email or web address].pl

## 2018-12-09 DIAGNOSIS — J189 Pneumonia, unspecified organism: Secondary | ICD-10-CM | POA: Diagnosis not present

## 2018-12-09 DIAGNOSIS — U071 COVID-19: Secondary | ICD-10-CM | POA: Diagnosis not present

## 2018-12-16 ENCOUNTER — Ambulatory Visit
Admission: RE | Admit: 2018-12-16 | Discharge: 2018-12-16 | Disposition: A | Discharge status: Home / Self Care | Payer: 59 | Source: Ambulatory Visit | Attending: Family Medicine | Admitting: Family Medicine

## 2018-12-16 ENCOUNTER — Other Ambulatory Visit: Payer: Self-pay | Admitting: Family Medicine

## 2018-12-16 DIAGNOSIS — J189 Pneumonia, unspecified organism: Secondary | ICD-10-CM

## 2018-12-16 DIAGNOSIS — J984 Other disorders of lung: Secondary | ICD-10-CM | POA: Diagnosis not present

## 2018-12-17 DIAGNOSIS — U071 COVID-19: Secondary | ICD-10-CM | POA: Diagnosis not present

## 2018-12-18 DIAGNOSIS — U071 COVID-19: Secondary | ICD-10-CM | POA: Diagnosis not present

## 2019-03-05 DIAGNOSIS — Z23 Encounter for immunization: Secondary | ICD-10-CM | POA: Diagnosis not present

## 2019-09-29 ENCOUNTER — Encounter: Payer: Self-pay | Admitting: *Deleted

## 2019-09-29 ENCOUNTER — Other Ambulatory Visit: Payer: Self-pay | Admitting: *Deleted

## 2019-09-29 NOTE — Patient Outreach (Signed)
[facility]) Care Management  09/29/2019  Heather Randall [birth year] [id number]   Initial outreach for [employer] BP program.  Heather Randall has a hx of HTN, OA of knee, Obesity, COVID infection. She resides with her husband. She works for [employer]. She has agreed to work with me on HTN management.  Outpatient Encounter Medications as of 09/29/2019  Medication Sig Note  . amLODipine (NORVASC) 5 MG tablet Take 5 mg by mouth daily.    Marland Kitchen ibuprofen (ADVIL) 200 MG tablet Take 200 mg by mouth every 8 (eight) hours as needed for moderate pain. May take 2-4 as needed for severity of knee pain. Take with or after food.   Marland Kitchen acetaminophen (TYLENOL) 325 MG tablet Take 2 tablets (650 mg total) by mouth every 6 (six) hours as needed for mild pain (or Fever >/= 101). (Patient not taking: Reported on 09/29/2019)   . BREO ELLIPTA 100-25 MCG/INH AEPB Inhale 1 puff into the lungs daily.  09/29/2019: Not taking routinely.  . [DISCONTINUED] dexamethasone (DECADRON) 6 MG tablet Take 1 tablet (6 mg total) by mouth daily.    No facility-administered encounter medications on file as of 09/29/2019.   Fall Risk  09/29/2019  Falls in the past year? 0  Number falls in past yr: 0  Injury with Fall? 0  Risk for fall due to : No Fall Risks  Follow up Falls evaluation completed   Depression screen [facility] 2/9 09/29/2019  Decreased Interest 0  Down, Depressed, Hopeless 0  PHQ - 2 Score 0   Functional Status Survey: Is the patient deaf or have difficulty hearing?: No Does the patient have difficulty seeing, even when wearing glasses/contacts?: No Does the patient have difficulty concentrating, remembering, or making decisions?: No Does the patient have difficulty walking or climbing stairs?: No Does the patient have difficulty dressing or bathing?: No Does the patient have difficulty doing errands alone such as visiting a doctor's office or shopping?: No  THN CM Care Plan Problem One    Most Recent Value  Care Plan Problem One  HTN  Care Plan for Problem One  Active  THN Long Term Goal   Pt will start monitoring her BP on a regular basis (at least 3 times a week, per report) over the next 90 days.  THN Long Term Goal Start Date  09/29/19  Interventions for Problem One Long Term Goal  Will provide BP cuff and dietary information.  THN CM Short Term Goal #1   Pt will read dietary information sent on low na diet, all about carbs and report dietary changes made in 30 days.  THN CM Short Term Goal #1 Start Date  09/29/19  Interventions for Short Term Goal #1  Sending low sodium information and All About Carbs.  THN CM Short Term Goal #2   Pt to increase her activity level to 3 days a week/15 minutes a day over the next 30 days.  Interventions for Short Term Goal #2  Assessed activity level, wt. Agreed on increasing activity.     I will call her monthly X 3 and then reduce frequency as necessary.  [name]. [name], MSN, [facility] Gerontological Nurse Practitioner [facility] Care Management [phone]

## 2019-10-28 ENCOUNTER — Other Ambulatory Visit: Payer: Self-pay | Admitting: *Deleted

## 2019-10-28 NOTE — Patient Outreach (Signed)
[facility]) Care Management  10/28/2019  IVIANNA NOTCH [birth year] [id number]  Telephone outreach to [employer] BP program participant.  Follow up on BP readings: Low Na diet: Carbs: Activity level:  Noralyn Pick C. [name], MSN, [facility] Gerontological Nurse Practitioner [facility] Care Management [phone]  10/29/19 Called pt again. Left another message for a return call.  [name]. [name], MSN, [facility] Gerontological Nurse Practitioner [facility] Care Management [phone]

## 2019-10-30 ENCOUNTER — Encounter: Payer: Self-pay | Admitting: *Deleted

## 2019-11-04 DIAGNOSIS — M542 Cervicalgia: Secondary | ICD-10-CM | POA: Diagnosis not present

## 2019-11-04 DIAGNOSIS — M25512 Pain in left shoulder: Secondary | ICD-10-CM | POA: Diagnosis not present

## 2019-11-11 DIAGNOSIS — N951 Menopausal and female climacteric states: Secondary | ICD-10-CM | POA: Diagnosis not present

## 2019-11-11 DIAGNOSIS — E039 Hypothyroidism, unspecified: Secondary | ICD-10-CM | POA: Diagnosis not present

## 2019-11-19 DIAGNOSIS — R69 Illness, unspecified: Secondary | ICD-10-CM | POA: Diagnosis not present

## 2019-11-19 DIAGNOSIS — R232 Flushing: Secondary | ICD-10-CM | POA: Diagnosis not present

## 2019-11-19 DIAGNOSIS — M2550 Pain in unspecified joint: Secondary | ICD-10-CM | POA: Diagnosis not present

## 2019-11-19 DIAGNOSIS — N951 Menopausal and female climacteric states: Secondary | ICD-10-CM | POA: Diagnosis not present

## 2019-11-21 ENCOUNTER — Encounter: Payer: Self-pay | Admitting: *Deleted

## 2019-11-21 ENCOUNTER — Other Ambulatory Visit: Payer: Self-pay | Admitting: *Deleted

## 2019-11-21 NOTE — Patient Outreach (Signed)
[facility]) Care Management  11/21/2019  Heather Randall Sep 07, 1960 [id number]   Third unsuccessful outreach. Left message that due to inability to contact will close her case. Also, encouraged her to call back if she does want to participate further.  [name]. [name], MSN, [facility] Gerontological Nurse Practitioner [facility] Care Management [phone]

## 2019-11-27 DIAGNOSIS — M25512 Pain in left shoulder: Secondary | ICD-10-CM | POA: Diagnosis not present

## 2019-12-08 DIAGNOSIS — M25512 Pain in left shoulder: Secondary | ICD-10-CM | POA: Diagnosis not present

## 2019-12-17 DIAGNOSIS — M7552 Bursitis of left shoulder: Secondary | ICD-10-CM | POA: Diagnosis not present

## 2019-12-17 DIAGNOSIS — M25512 Pain in left shoulder: Secondary | ICD-10-CM | POA: Diagnosis not present

## 2019-12-17 DIAGNOSIS — M19012 Primary osteoarthritis, left shoulder: Secondary | ICD-10-CM | POA: Diagnosis not present

## 2019-12-17 DIAGNOSIS — M7522 Bicipital tendinitis, left shoulder: Secondary | ICD-10-CM | POA: Diagnosis not present

## 2019-12-17 DIAGNOSIS — G8918 Other acute postprocedural pain: Secondary | ICD-10-CM | POA: Diagnosis not present

## 2019-12-17 DIAGNOSIS — M7542 Impingement syndrome of left shoulder: Secondary | ICD-10-CM | POA: Diagnosis not present

## 2019-12-17 DIAGNOSIS — M24112 Other articular cartilage disorders, left shoulder: Secondary | ICD-10-CM | POA: Diagnosis not present

## 2019-12-22 DIAGNOSIS — M7522 Bicipital tendinitis, left shoulder: Secondary | ICD-10-CM | POA: Diagnosis not present

## 2019-12-22 DIAGNOSIS — M6281 Muscle weakness (generalized): Secondary | ICD-10-CM | POA: Diagnosis not present

## 2019-12-22 DIAGNOSIS — M25612 Stiffness of left shoulder, not elsewhere classified: Secondary | ICD-10-CM | POA: Diagnosis not present

## 2019-12-22 DIAGNOSIS — M24112 Other articular cartilage disorders, left shoulder: Secondary | ICD-10-CM | POA: Diagnosis not present

## 2019-12-23 DIAGNOSIS — M24112 Other articular cartilage disorders, left shoulder: Secondary | ICD-10-CM | POA: Diagnosis not present

## 2019-12-26 DIAGNOSIS — M6281 Muscle weakness (generalized): Secondary | ICD-10-CM | POA: Diagnosis not present

## 2019-12-26 DIAGNOSIS — M7522 Bicipital tendinitis, left shoulder: Secondary | ICD-10-CM | POA: Diagnosis not present

## 2019-12-26 DIAGNOSIS — M24112 Other articular cartilage disorders, left shoulder: Secondary | ICD-10-CM | POA: Diagnosis not present

## 2019-12-26 DIAGNOSIS — M25612 Stiffness of left shoulder, not elsewhere classified: Secondary | ICD-10-CM | POA: Diagnosis not present

## 2019-12-29 DIAGNOSIS — M24112 Other articular cartilage disorders, left shoulder: Secondary | ICD-10-CM | POA: Diagnosis not present

## 2019-12-29 DIAGNOSIS — M25612 Stiffness of left shoulder, not elsewhere classified: Secondary | ICD-10-CM | POA: Diagnosis not present

## 2019-12-29 DIAGNOSIS — M6281 Muscle weakness (generalized): Secondary | ICD-10-CM | POA: Diagnosis not present

## 2019-12-29 DIAGNOSIS — M7522 Bicipital tendinitis, left shoulder: Secondary | ICD-10-CM | POA: Diagnosis not present

## 2020-01-02 DIAGNOSIS — M25612 Stiffness of left shoulder, not elsewhere classified: Secondary | ICD-10-CM | POA: Diagnosis not present

## 2020-01-02 DIAGNOSIS — M6281 Muscle weakness (generalized): Secondary | ICD-10-CM | POA: Diagnosis not present

## 2020-01-02 DIAGNOSIS — M25512 Pain in left shoulder: Secondary | ICD-10-CM | POA: Diagnosis not present

## 2020-01-02 DIAGNOSIS — M7522 Bicipital tendinitis, left shoulder: Secondary | ICD-10-CM | POA: Diagnosis not present

## 2020-01-07 DIAGNOSIS — M7522 Bicipital tendinitis, left shoulder: Secondary | ICD-10-CM | POA: Diagnosis not present

## 2020-01-07 DIAGNOSIS — M25612 Stiffness of left shoulder, not elsewhere classified: Secondary | ICD-10-CM | POA: Diagnosis not present

## 2020-01-07 DIAGNOSIS — M25512 Pain in left shoulder: Secondary | ICD-10-CM | POA: Diagnosis not present

## 2020-01-07 DIAGNOSIS — M6281 Muscle weakness (generalized): Secondary | ICD-10-CM | POA: Diagnosis not present

## 2020-01-09 DIAGNOSIS — M7522 Bicipital tendinitis, left shoulder: Secondary | ICD-10-CM | POA: Diagnosis not present

## 2020-01-09 DIAGNOSIS — M25512 Pain in left shoulder: Secondary | ICD-10-CM | POA: Diagnosis not present

## 2020-01-09 DIAGNOSIS — M6281 Muscle weakness (generalized): Secondary | ICD-10-CM | POA: Diagnosis not present

## 2020-01-09 DIAGNOSIS — M25612 Stiffness of left shoulder, not elsewhere classified: Secondary | ICD-10-CM | POA: Diagnosis not present

## 2020-01-13 ENCOUNTER — Ambulatory Visit
Admission: RE | Admit: 2020-01-13 | Discharge: 2020-01-13 | Disposition: A | Discharge status: Home / Self Care | Payer: 59 | Source: Ambulatory Visit | Attending: Physician Assistant | Admitting: Physician Assistant

## 2020-01-13 ENCOUNTER — Other Ambulatory Visit: Payer: Self-pay | Admitting: Physician Assistant

## 2020-01-13 ENCOUNTER — Other Ambulatory Visit: Payer: Self-pay

## 2020-01-13 DIAGNOSIS — M25472 Effusion, left ankle: Secondary | ICD-10-CM

## 2020-01-13 DIAGNOSIS — M7989 Other specified soft tissue disorders: Secondary | ICD-10-CM

## 2020-01-13 DIAGNOSIS — M25612 Stiffness of left shoulder, not elsewhere classified: Secondary | ICD-10-CM | POA: Diagnosis not present

## 2020-01-13 DIAGNOSIS — M7522 Bicipital tendinitis, left shoulder: Secondary | ICD-10-CM | POA: Diagnosis not present

## 2020-01-13 DIAGNOSIS — M24112 Other articular cartilage disorders, left shoulder: Secondary | ICD-10-CM | POA: Diagnosis not present

## 2020-01-13 DIAGNOSIS — G4700 Insomnia, unspecified: Secondary | ICD-10-CM | POA: Diagnosis not present

## 2020-01-13 DIAGNOSIS — M6281 Muscle weakness (generalized): Secondary | ICD-10-CM | POA: Diagnosis not present

## 2020-01-13 DIAGNOSIS — R600 Localized edema: Secondary | ICD-10-CM | POA: Diagnosis not present

## 2020-01-19 DIAGNOSIS — M25512 Pain in left shoulder: Secondary | ICD-10-CM | POA: Diagnosis not present

## 2020-01-19 DIAGNOSIS — M25612 Stiffness of left shoulder, not elsewhere classified: Secondary | ICD-10-CM | POA: Diagnosis not present

## 2020-01-19 DIAGNOSIS — M6281 Muscle weakness (generalized): Secondary | ICD-10-CM | POA: Diagnosis not present

## 2020-01-19 DIAGNOSIS — M7522 Bicipital tendinitis, left shoulder: Secondary | ICD-10-CM | POA: Diagnosis not present

## 2020-01-20 DIAGNOSIS — S8265XA Nondisplaced fracture of lateral malleolus of left fibula, initial encounter for closed fracture: Secondary | ICD-10-CM | POA: Diagnosis not present

## 2020-01-21 DIAGNOSIS — M25512 Pain in left shoulder: Secondary | ICD-10-CM | POA: Diagnosis not present

## 2020-01-21 DIAGNOSIS — M7522 Bicipital tendinitis, left shoulder: Secondary | ICD-10-CM | POA: Diagnosis not present

## 2020-01-21 DIAGNOSIS — M6281 Muscle weakness (generalized): Secondary | ICD-10-CM | POA: Diagnosis not present

## 2020-01-21 DIAGNOSIS — M25612 Stiffness of left shoulder, not elsewhere classified: Secondary | ICD-10-CM | POA: Diagnosis not present

## 2020-01-23 ENCOUNTER — Other Ambulatory Visit: Payer: Self-pay | Admitting: Critical Care Medicine

## 2020-01-23 ENCOUNTER — Other Ambulatory Visit: Payer: 59

## 2020-01-23 DIAGNOSIS — Z20822 Contact with and (suspected) exposure to covid-19: Secondary | ICD-10-CM | POA: Diagnosis not present

## 2020-01-25 LAB — SARS-COV-2, NAA 2 DAY TAT

## 2020-01-25 LAB — NOVEL CORONAVIRUS, NAA: SARS-CoV-2, NAA: NOT DETECTED

## 2020-01-27 DIAGNOSIS — M25512 Pain in left shoulder: Secondary | ICD-10-CM | POA: Diagnosis not present

## 2020-01-27 DIAGNOSIS — M25612 Stiffness of left shoulder, not elsewhere classified: Secondary | ICD-10-CM | POA: Diagnosis not present

## 2020-01-27 DIAGNOSIS — M6281 Muscle weakness (generalized): Secondary | ICD-10-CM | POA: Diagnosis not present

## 2020-01-27 DIAGNOSIS — M7522 Bicipital tendinitis, left shoulder: Secondary | ICD-10-CM | POA: Diagnosis not present

## 2020-01-29 DIAGNOSIS — M25512 Pain in left shoulder: Secondary | ICD-10-CM | POA: Diagnosis not present

## 2020-01-29 DIAGNOSIS — M25612 Stiffness of left shoulder, not elsewhere classified: Secondary | ICD-10-CM | POA: Diagnosis not present

## 2020-01-29 DIAGNOSIS — M7522 Bicipital tendinitis, left shoulder: Secondary | ICD-10-CM | POA: Diagnosis not present

## 2020-01-29 DIAGNOSIS — M6281 Muscle weakness (generalized): Secondary | ICD-10-CM | POA: Diagnosis not present

## 2020-02-03 DIAGNOSIS — E559 Vitamin D deficiency, unspecified: Secondary | ICD-10-CM | POA: Diagnosis not present

## 2020-02-03 DIAGNOSIS — M25512 Pain in left shoulder: Secondary | ICD-10-CM | POA: Diagnosis not present

## 2020-02-03 DIAGNOSIS — M7522 Bicipital tendinitis, left shoulder: Secondary | ICD-10-CM | POA: Diagnosis not present

## 2020-02-03 DIAGNOSIS — M25612 Stiffness of left shoulder, not elsewhere classified: Secondary | ICD-10-CM | POA: Diagnosis not present

## 2020-02-03 DIAGNOSIS — M6281 Muscle weakness (generalized): Secondary | ICD-10-CM | POA: Diagnosis not present

## 2020-02-03 DIAGNOSIS — M810 Age-related osteoporosis without current pathological fracture: Secondary | ICD-10-CM | POA: Diagnosis not present

## 2020-02-03 DIAGNOSIS — R5383 Other fatigue: Secondary | ICD-10-CM | POA: Diagnosis not present

## 2020-02-04 DIAGNOSIS — Z13820 Encounter for screening for osteoporosis: Secondary | ICD-10-CM | POA: Diagnosis not present

## 2020-02-04 DIAGNOSIS — Z780 Asymptomatic menopausal state: Secondary | ICD-10-CM | POA: Diagnosis not present

## 2020-02-11 DIAGNOSIS — M25612 Stiffness of left shoulder, not elsewhere classified: Secondary | ICD-10-CM | POA: Diagnosis not present

## 2020-02-11 DIAGNOSIS — M24112 Other articular cartilage disorders, left shoulder: Secondary | ICD-10-CM | POA: Diagnosis not present

## 2020-02-11 DIAGNOSIS — M6281 Muscle weakness (generalized): Secondary | ICD-10-CM | POA: Diagnosis not present

## 2020-02-11 DIAGNOSIS — M7522 Bicipital tendinitis, left shoulder: Secondary | ICD-10-CM | POA: Diagnosis not present

## 2020-02-12 DIAGNOSIS — E559 Vitamin D deficiency, unspecified: Secondary | ICD-10-CM | POA: Diagnosis not present

## 2020-02-17 DIAGNOSIS — M25572 Pain in left ankle and joints of left foot: Secondary | ICD-10-CM | POA: Diagnosis not present

## 2020-02-19 DIAGNOSIS — M7522 Bicipital tendinitis, left shoulder: Secondary | ICD-10-CM | POA: Diagnosis not present

## 2020-02-19 DIAGNOSIS — M25612 Stiffness of left shoulder, not elsewhere classified: Secondary | ICD-10-CM | POA: Diagnosis not present

## 2020-02-19 DIAGNOSIS — M6281 Muscle weakness (generalized): Secondary | ICD-10-CM | POA: Diagnosis not present

## 2020-02-19 DIAGNOSIS — M25512 Pain in left shoulder: Secondary | ICD-10-CM | POA: Diagnosis not present

## 2020-03-23 DIAGNOSIS — S8265XA Nondisplaced fracture of lateral malleolus of left fibula, initial encounter for closed fracture: Secondary | ICD-10-CM | POA: Diagnosis not present

## 2020-05-19 ENCOUNTER — Other Ambulatory Visit (HOSPITAL_COMMUNITY)
Admission: RE | Admit: 2020-05-19 | Discharge: 2020-05-19 | Disposition: A | Discharge status: Home / Self Care | Payer: 59 | Source: Ambulatory Visit | Attending: Family Medicine | Admitting: Family Medicine

## 2020-05-19 ENCOUNTER — Other Ambulatory Visit: Payer: Self-pay | Admitting: Family Medicine

## 2020-05-19 DIAGNOSIS — R69 Illness, unspecified: Secondary | ICD-10-CM | POA: Diagnosis not present

## 2020-05-19 DIAGNOSIS — E669 Obesity, unspecified: Secondary | ICD-10-CM | POA: Diagnosis not present

## 2020-05-19 DIAGNOSIS — G4700 Insomnia, unspecified: Secondary | ICD-10-CM | POA: Diagnosis not present

## 2020-05-19 DIAGNOSIS — K219 Gastro-esophageal reflux disease without esophagitis: Secondary | ICD-10-CM | POA: Diagnosis not present

## 2020-05-19 DIAGNOSIS — Z0000 Encounter for general adult medical examination without abnormal findings: Secondary | ICD-10-CM | POA: Diagnosis not present

## 2020-05-19 DIAGNOSIS — Z23 Encounter for immunization: Secondary | ICD-10-CM | POA: Diagnosis not present

## 2020-05-19 DIAGNOSIS — I10 Essential (primary) hypertension: Secondary | ICD-10-CM | POA: Diagnosis not present

## 2020-05-19 DIAGNOSIS — Z124 Encounter for screening for malignant neoplasm of cervix: Secondary | ICD-10-CM | POA: Diagnosis not present

## 2020-05-19 DIAGNOSIS — Z13220 Encounter for screening for lipoid disorders: Secondary | ICD-10-CM | POA: Diagnosis not present

## 2020-05-19 DIAGNOSIS — J9801 Acute bronchospasm: Secondary | ICD-10-CM | POA: Diagnosis not present

## 2020-05-20 IMAGING — DX PORTABLE CHEST - 1 VIEW
1 series · 1 of 1 positions shown · non-contrast
Comparison: None.

CLINICAL DATA: Cough, shortness of breath.

EXAM:
PORTABLE CHEST 1 VIEW

[chest ap]
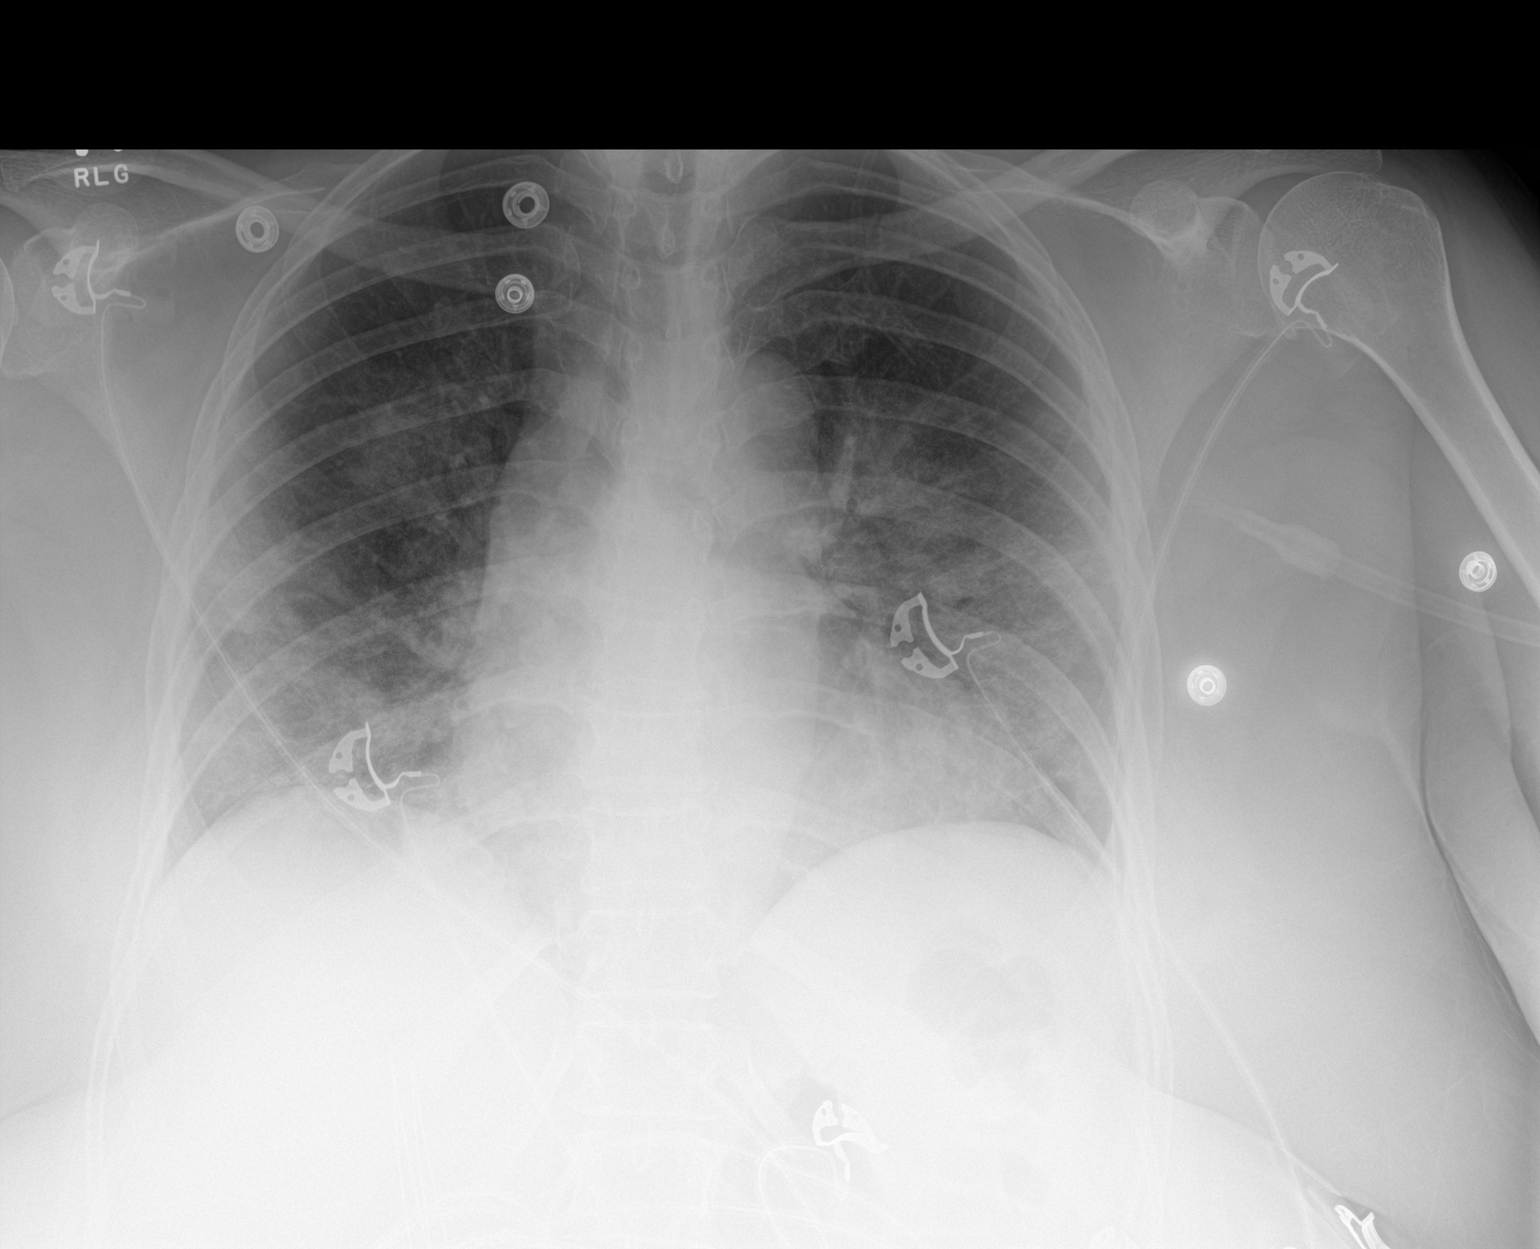

[1 of 1 positions shown; findings below may reference images not displayed]

FINDINGS: The heart size and mediastinal contours are within normal limits. No
pneumothorax or pleural effusion is noted. Bilateral patchy airspace
opacities are noted in the perihilar and basilar regions
bilaterally, right greater than left. The visualized skeletal
structures are unremarkable.
IMPRESSION: Findings consistent with multifocal pneumonia.

## 2020-05-21 IMAGING — DX PORTABLE CHEST - 1 VIEW
1 series · 1 of 1 positions shown · non-contrast
Comparison: 11/27/2018

CLINICAL DATA: Short of breath

EXAM:
PORTABLE CHEST 1 VIEW

[chest]
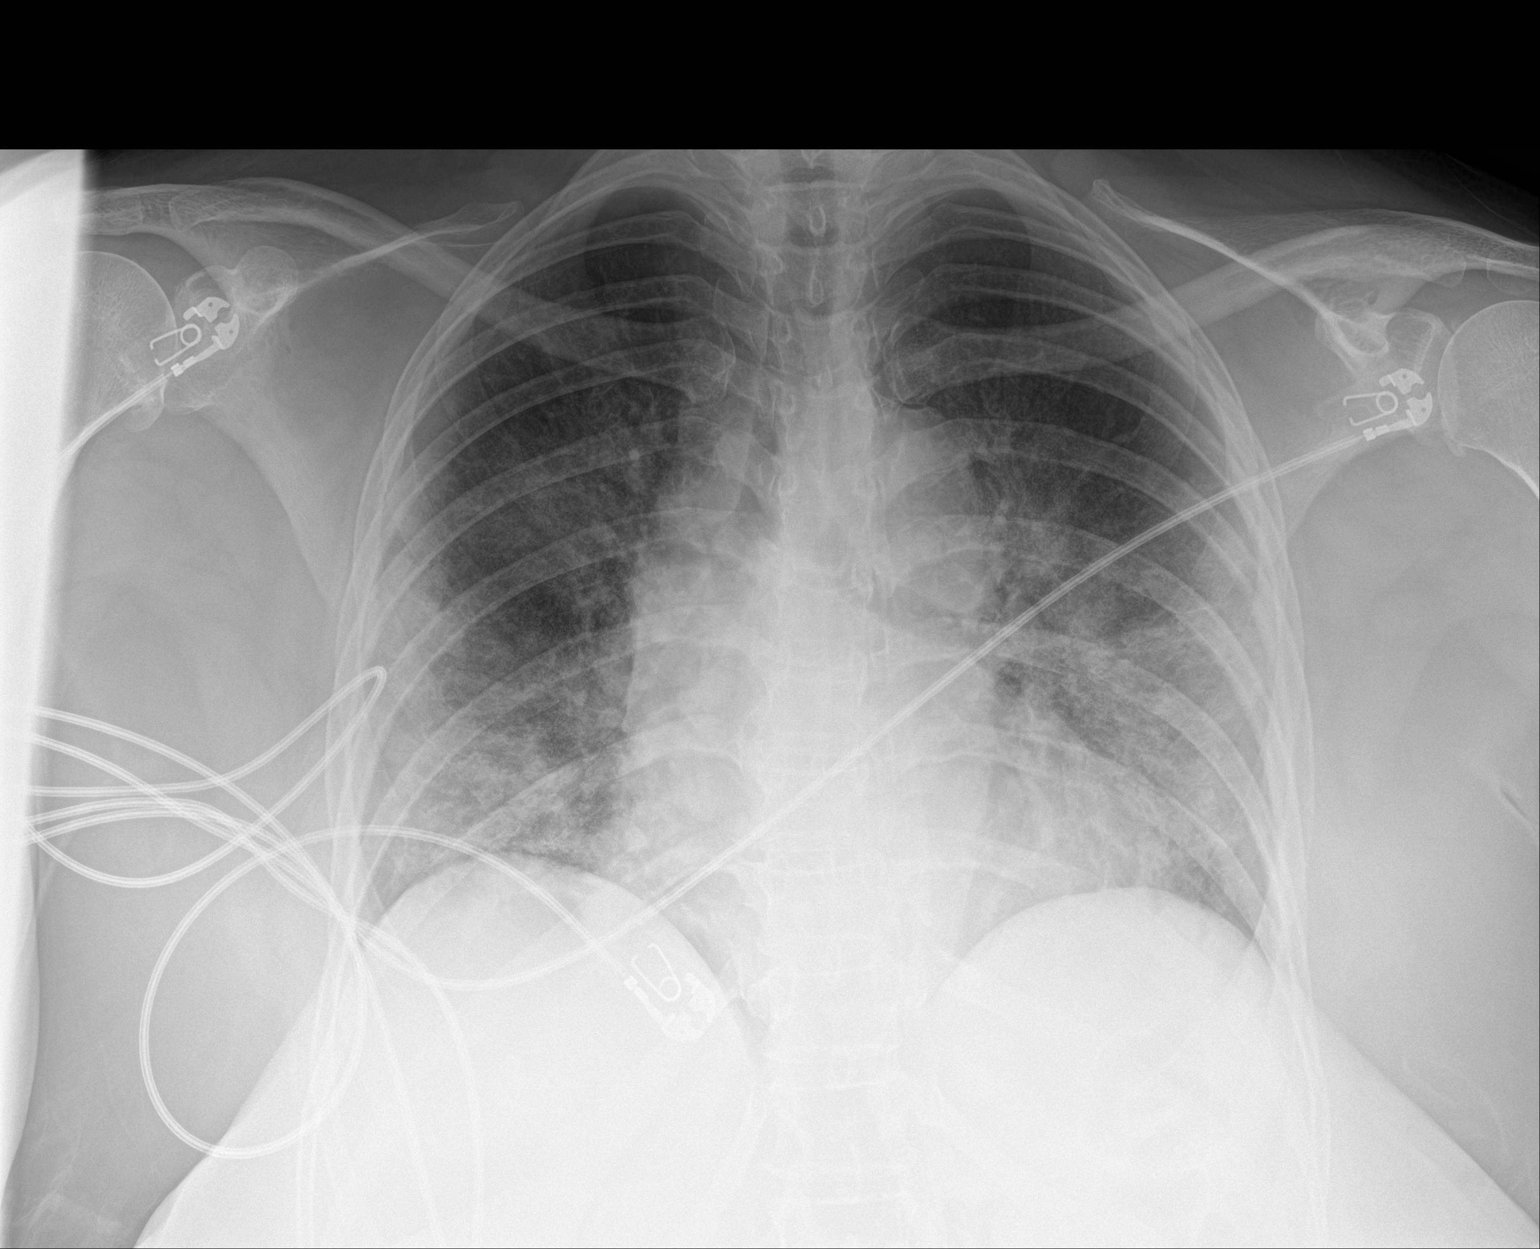

[1 of 1 positions shown; findings below may reference images not displayed]

FINDINGS: Progression of bilateral airspace disease with basilar predominance.
No pleural effusion. Heart size mildly enlarged. No acute skeletal
abnormality.
IMPRESSION: Progression of bilateral airspace disease with basilar predominance.

## 2020-05-25 LAB — CYTOLOGY - PAP
Comment: NEGATIVE
Diagnosis: NEGATIVE
High risk HPV: NEGATIVE

## 2020-06-08 IMAGING — DX CHEST - 2 VIEW
2 series · 2 of 2 positions shown · non-contrast
Comparison: 11/28/2018

CLINICAL DATA: Recent pneumonia.

EXAM:
CHEST - 2 VIEW

[dg chest 2 view (1 of 2)]
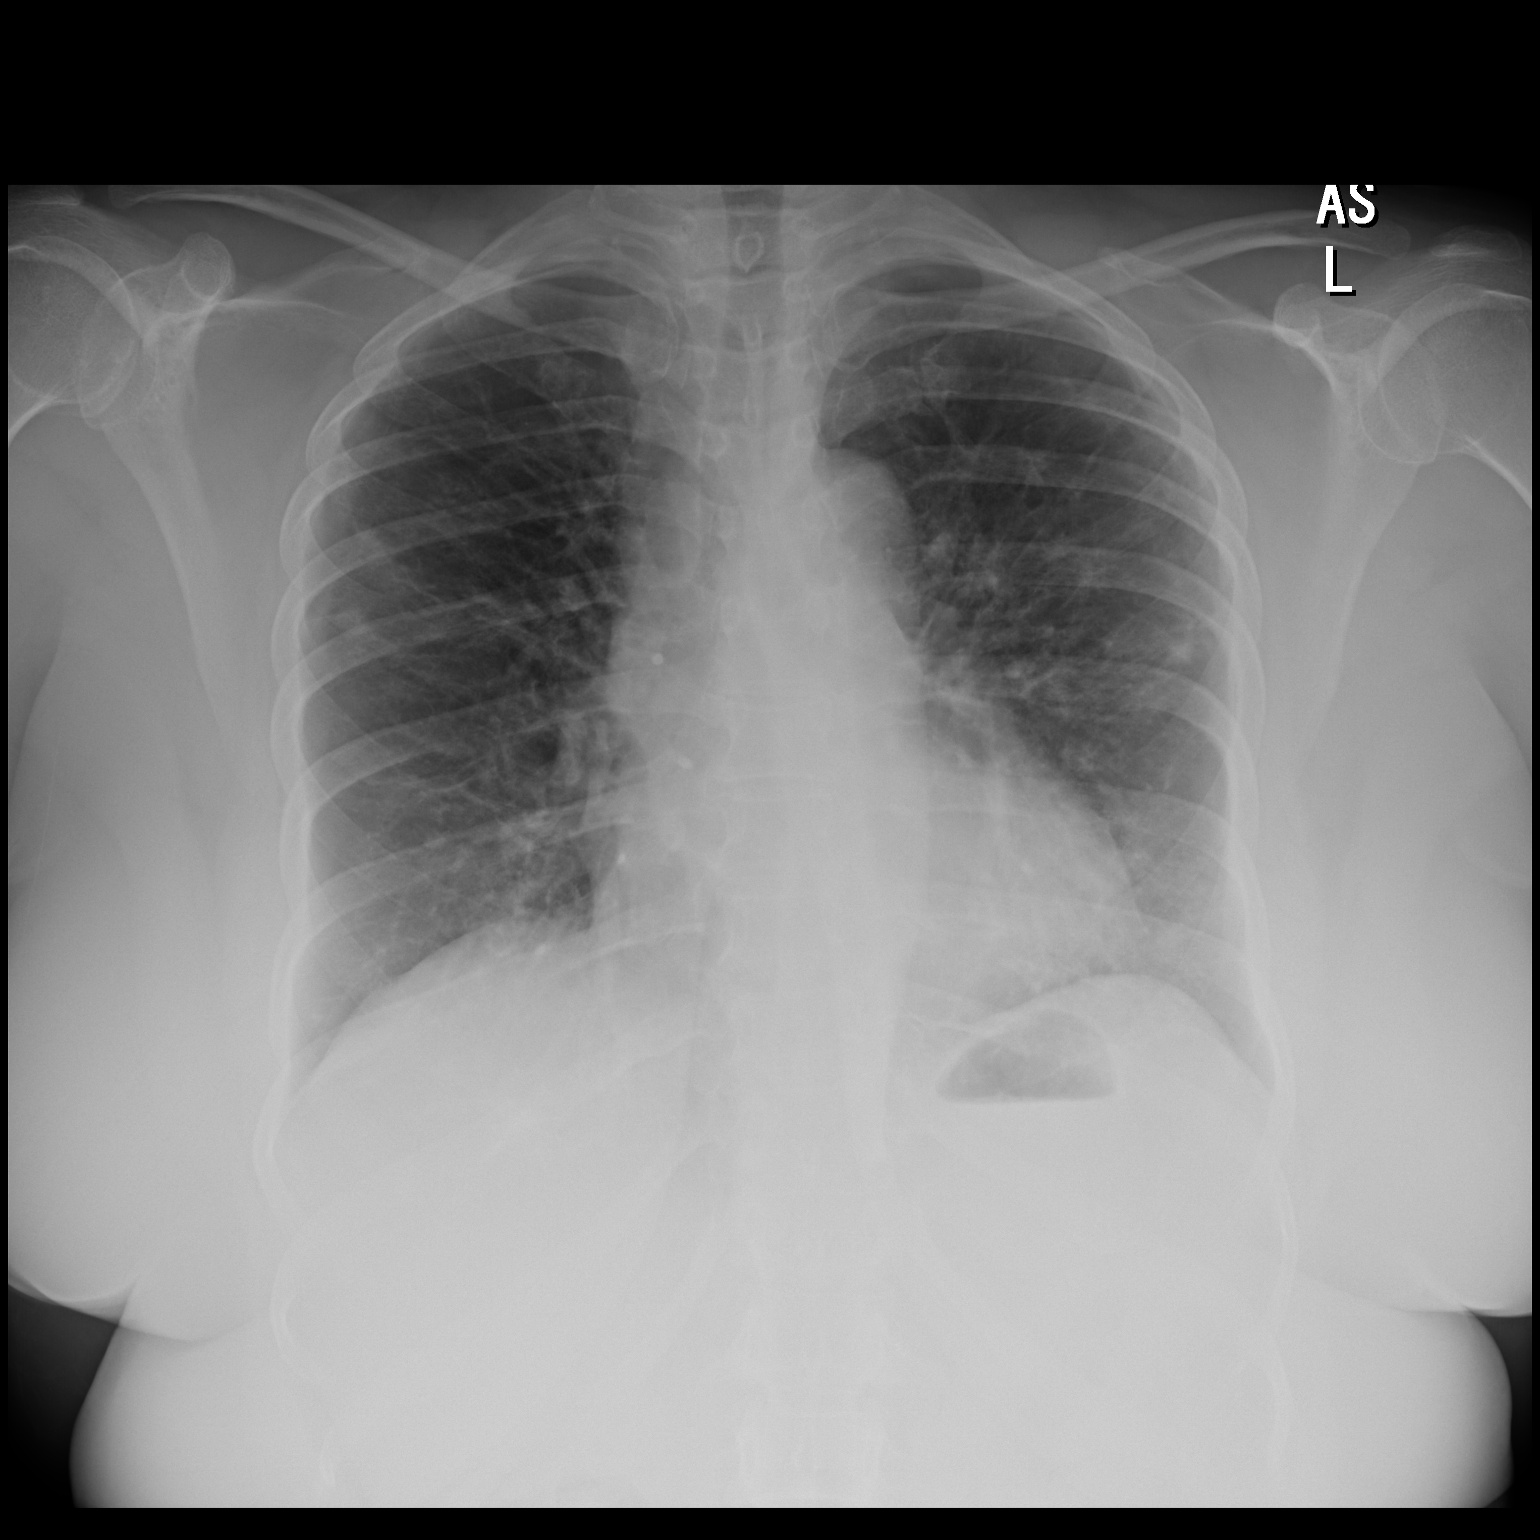

[dg chest 2 view (2 of 2)]
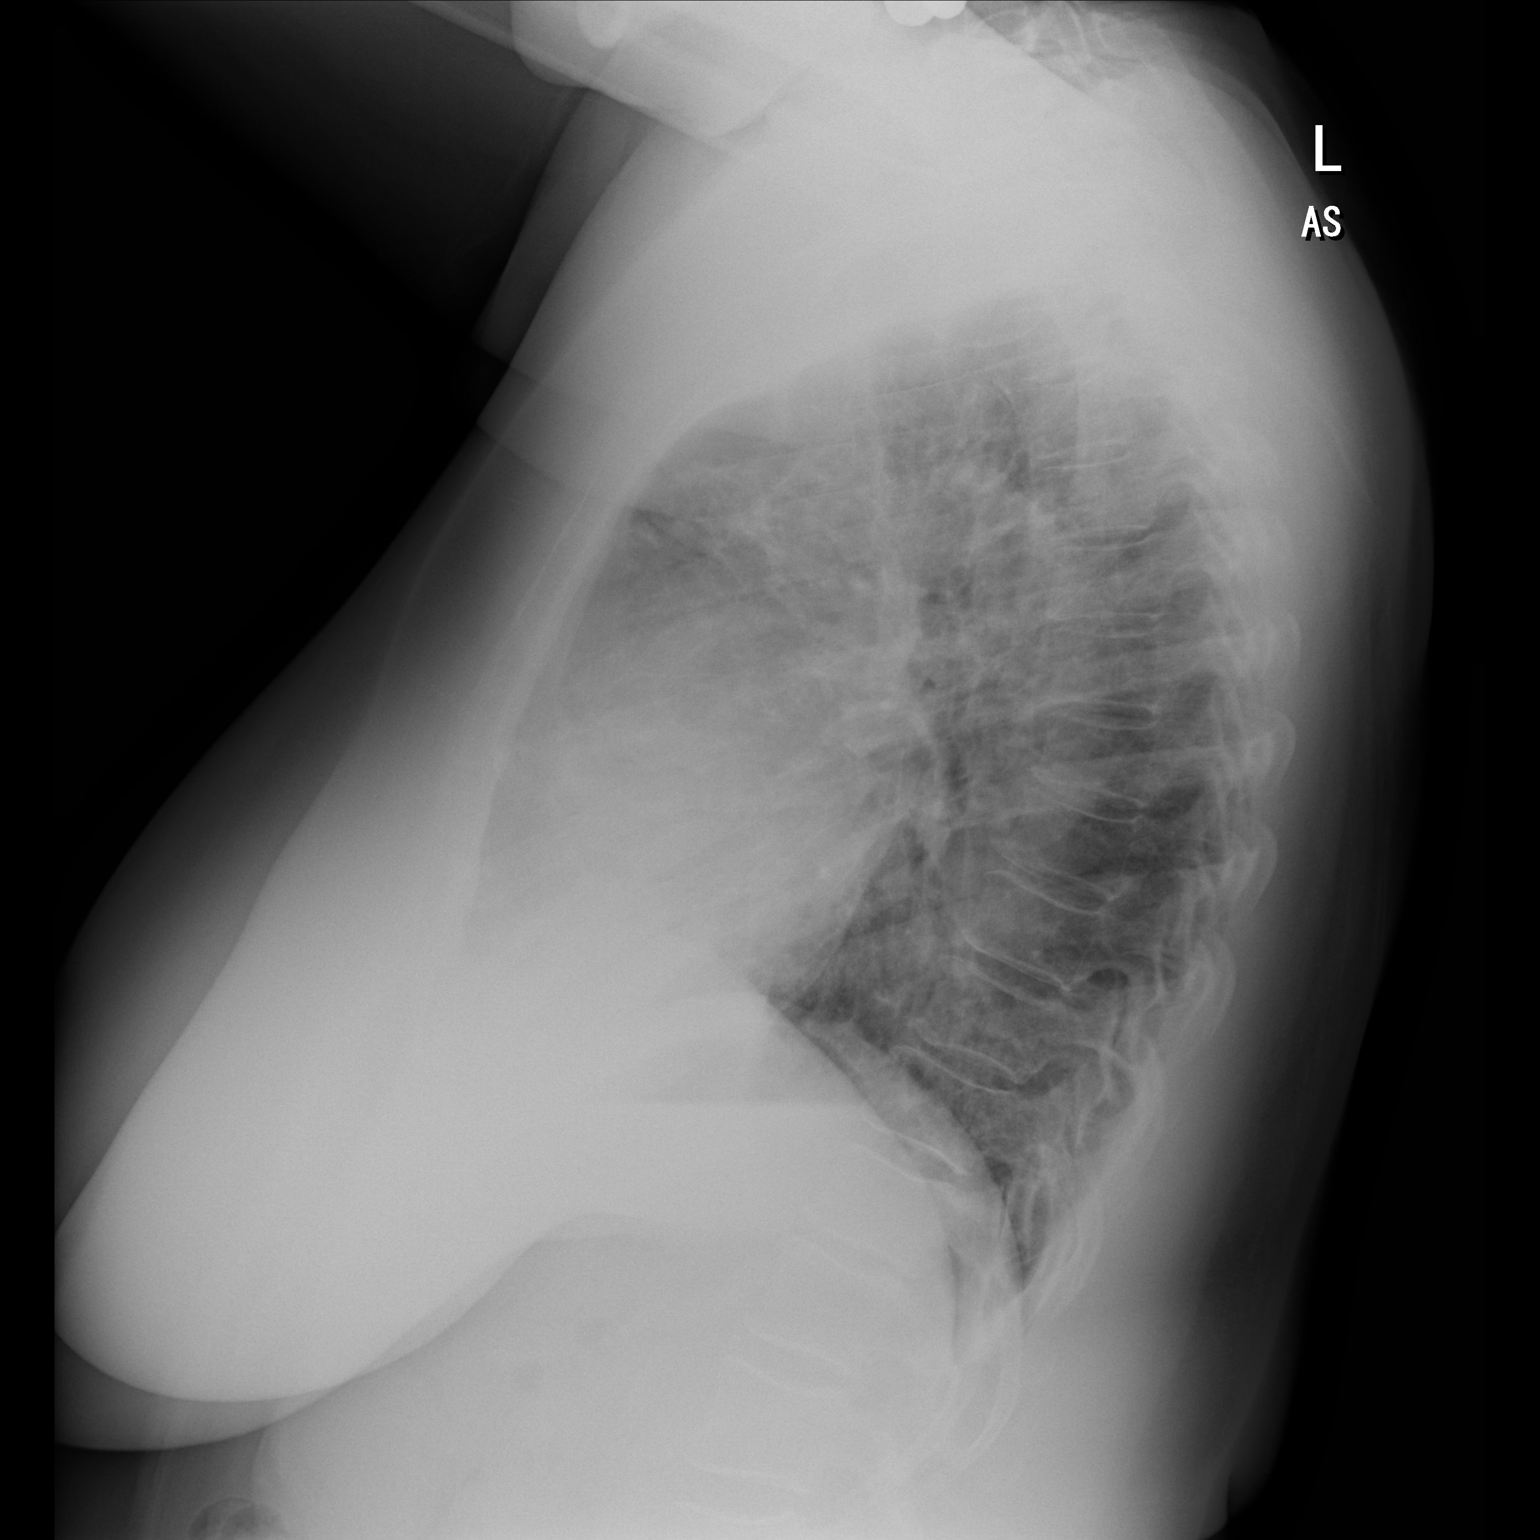

[2 of 2 positions shown; findings below may reference images not displayed]

FINDINGS: Improving aeration in the lungs. Minimal residual nodular opacities
in the mid lungs and lower lungs, likely resolving pneumonia. No
effusions. Heart is normal size.
IMPRESSION: Improving aeration with decreasing bilateral airspace disease.
Minimal residual nodular densities in the mid lungs and lower lungs,
likely residual infiltrates. This could be followed with repeat
chest x-ray in 3-6 weeks to ensure complete resolution.

## 2020-06-17 DIAGNOSIS — Z1152 Encounter for screening for COVID-19: Secondary | ICD-10-CM | POA: Diagnosis not present

## 2020-06-18 ENCOUNTER — Other Ambulatory Visit: Payer: 59

## 2020-08-05 DIAGNOSIS — Z01812 Encounter for preprocedural laboratory examination: Secondary | ICD-10-CM | POA: Diagnosis not present

## 2020-08-09 DIAGNOSIS — Z86010 Personal history of colonic polyps: Secondary | ICD-10-CM | POA: Diagnosis not present

## 2020-08-25 DIAGNOSIS — Z23 Encounter for immunization: Secondary | ICD-10-CM | POA: Diagnosis not present

## 2021-07-06 IMAGING — US US EXTREM LOW VENOUS*L*
1 series · 14 of 24 positions shown · non-contrast
Comparison: None.

CLINICAL DATA: Ankle swelling

EXAM:
LEFT LOWER EXTREMITY VENOUS DOPPLER ULTRASOUND
TECHNIQUE: Gray-scale sonography with compression, as well as color and duplex
ultrasound, were performed to evaluate the deep venous system(s)
from the level of the common femoral vein through the popliteal and
proximal calf veins.

[Series 1: us extrem low venous*left* · 0.08mm/px · 14 of 28 slices shown]
[im 1/28]
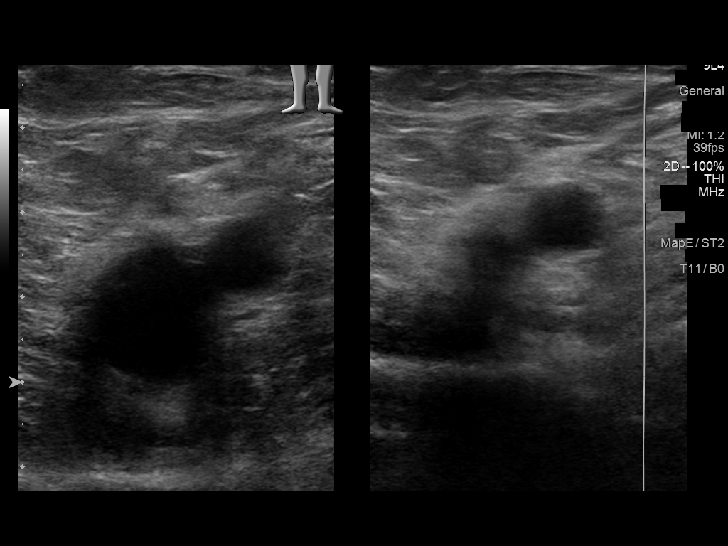
[im 3/28]
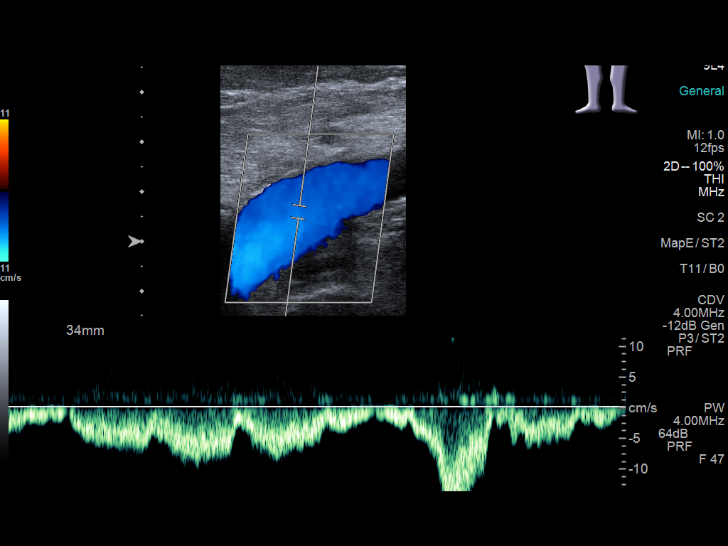
[im 5/28]
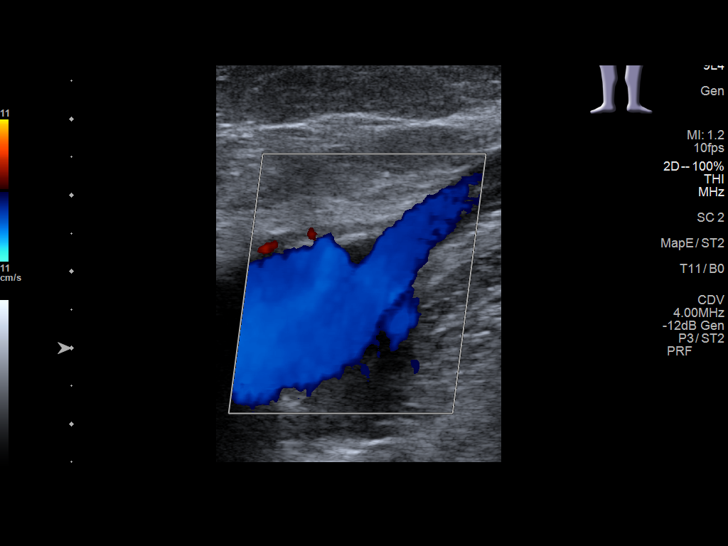
[im 8/28]
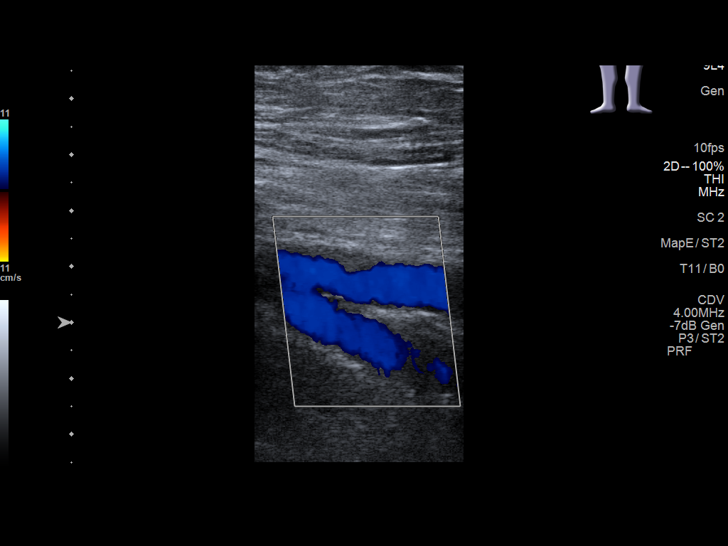
[im 9/28]
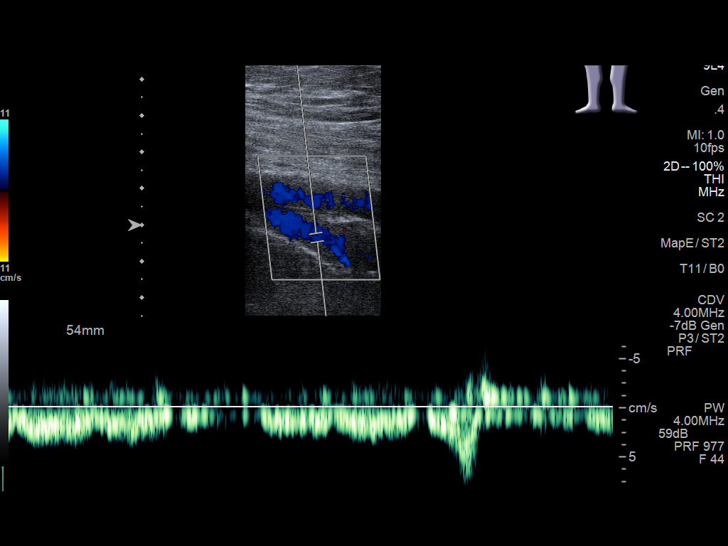
[im 11/28]
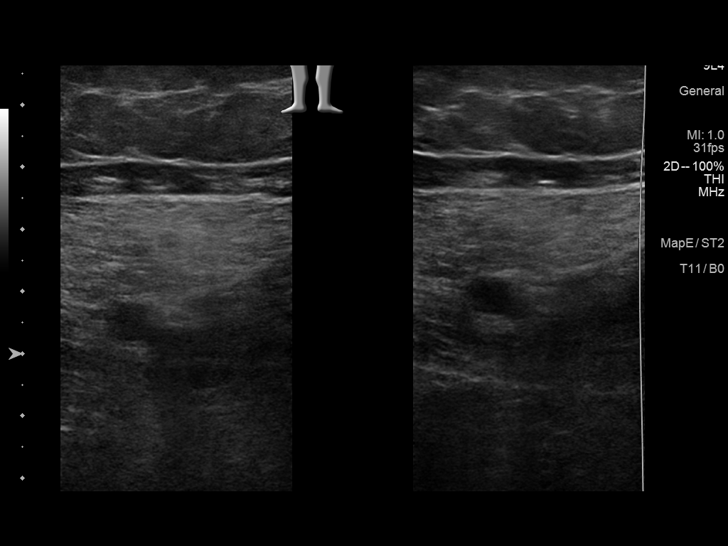
[im 13/28]
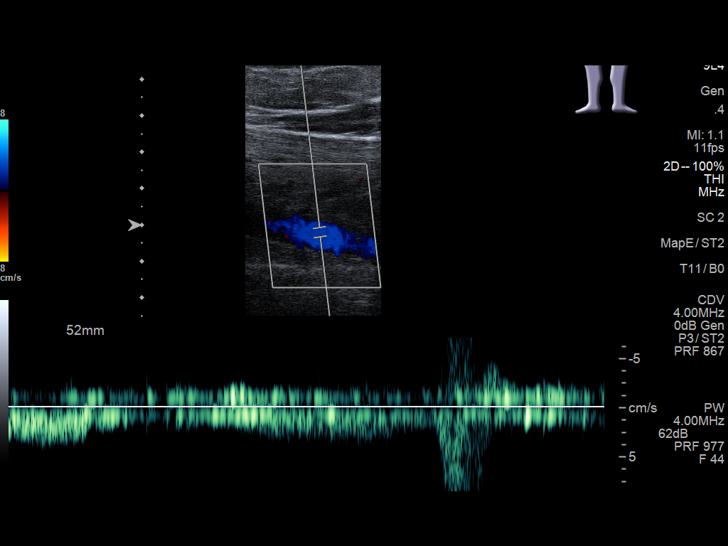
[im 15/28]
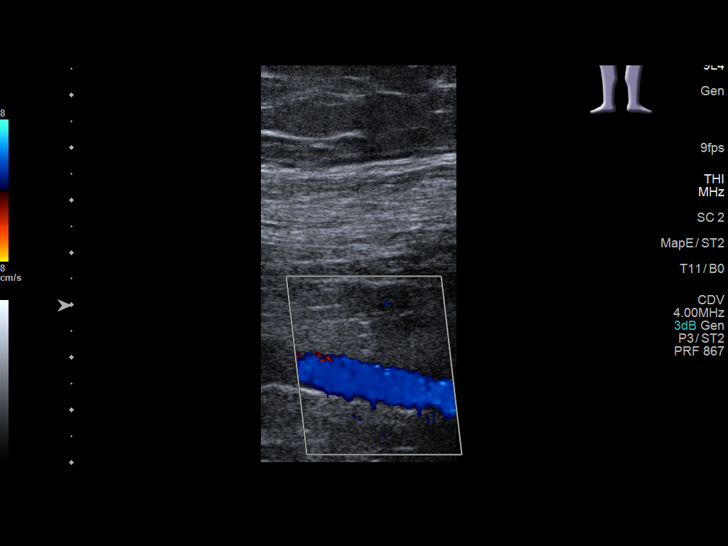
[im 17/28]
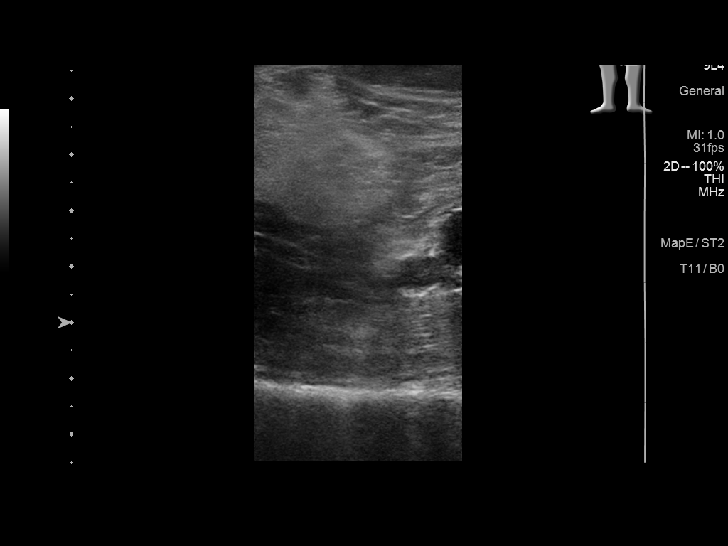
[im 19/28]
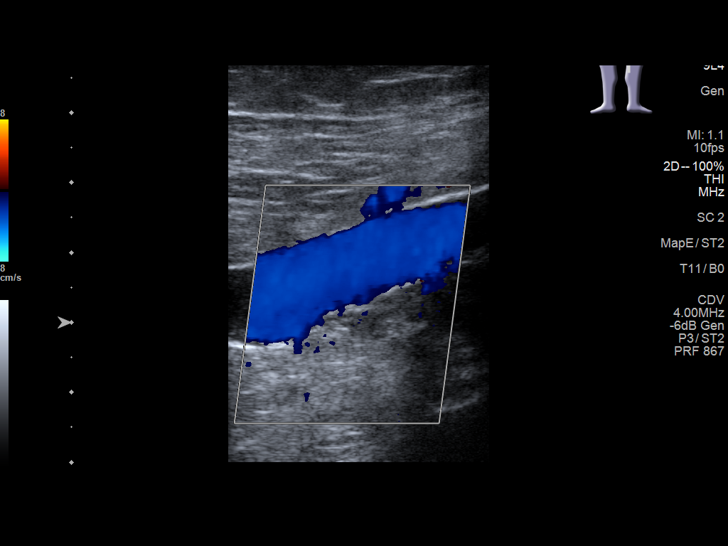
[im 22/28]
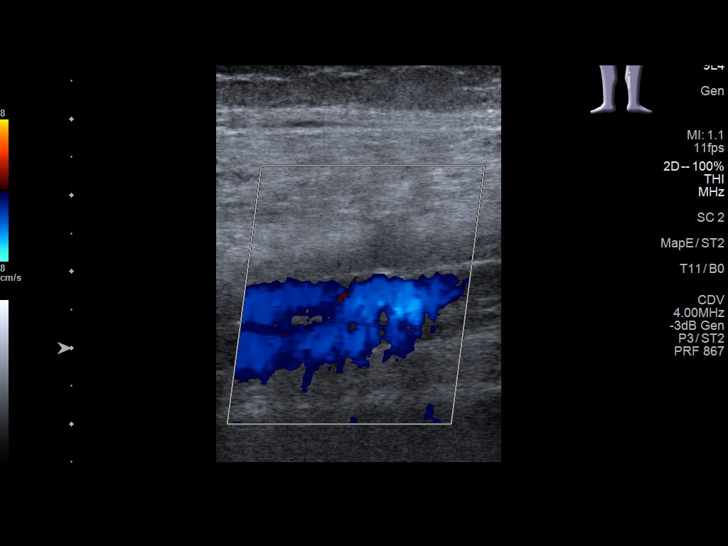
[im 23/28]
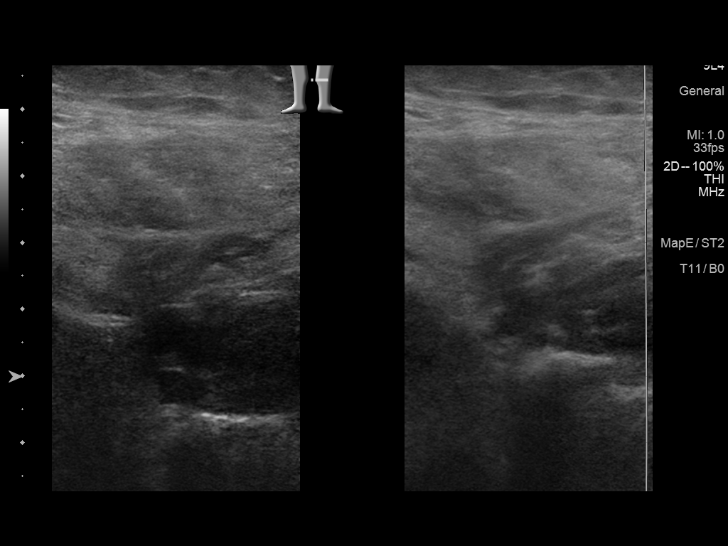
[im 25/28]
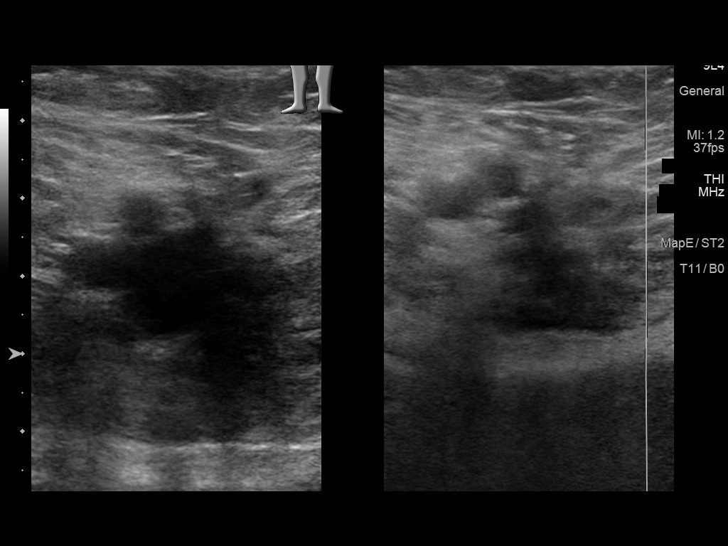
[im 28/28]
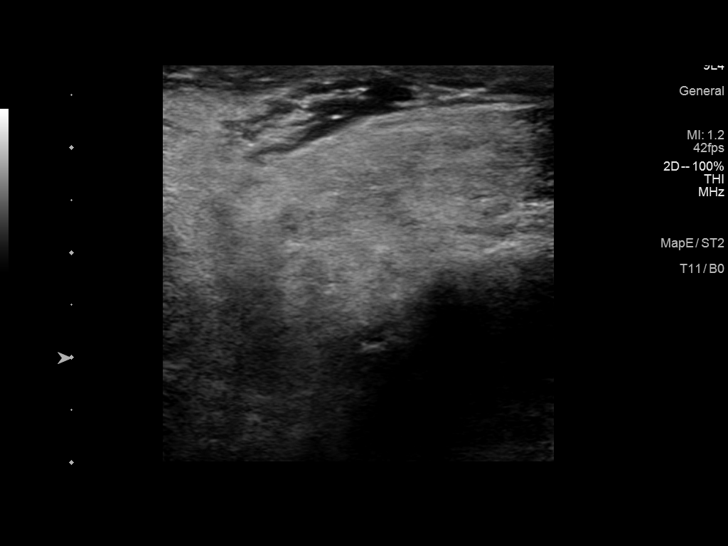

[14 of 24 positions shown; findings below may reference images not displayed]

FINDINGS: VENOUS

Normal compressibility of the common femoral, superficial femoral,
and popliteal veins, as well as the visualized calf veins.
Visualized portions of profunda femoral vein and great saphenous
vein unremarkable. No filling defects to suggest DVT on grayscale or
color Doppler imaging. Doppler waveforms show normal direction of
venous flow, normal respiratory phasicity and response to
augmentation.

Limited views of the contralateral common femoral vein are
unremarkable.

OTHER

Focal subcutaneous edema laterally at the  ankle.

Limitations: none
IMPRESSION: No femoropopliteal DVT nor evidence of DVT within the visualized
calf veins.

If clinical symptoms are inconsistent or if there are persistent or
worsening symptoms, further imaging (possibly involving the iliac
veins) may be warranted.

## 2021-09-16 DIAGNOSIS — N951 Menopausal and female climacteric states: Secondary | ICD-10-CM | POA: Diagnosis not present

## 2021-09-16 DIAGNOSIS — E039 Hypothyroidism, unspecified: Secondary | ICD-10-CM | POA: Diagnosis not present

## 2021-09-16 DIAGNOSIS — R635 Abnormal weight gain: Secondary | ICD-10-CM | POA: Diagnosis not present

## 2021-09-16 DIAGNOSIS — E785 Hyperlipidemia, unspecified: Secondary | ICD-10-CM | POA: Diagnosis not present

## 2021-09-16 DIAGNOSIS — R5383 Other fatigue: Secondary | ICD-10-CM | POA: Diagnosis not present

## 2021-09-16 DIAGNOSIS — E559 Vitamin D deficiency, unspecified: Secondary | ICD-10-CM | POA: Diagnosis not present

## 2021-09-16 DIAGNOSIS — R7309 Other abnormal glucose: Secondary | ICD-10-CM | POA: Diagnosis not present

## 2021-09-20 DIAGNOSIS — N951 Menopausal and female climacteric states: Secondary | ICD-10-CM | POA: Diagnosis not present

## 2021-09-20 DIAGNOSIS — R635 Abnormal weight gain: Secondary | ICD-10-CM | POA: Diagnosis not present

## 2021-09-20 DIAGNOSIS — Z1339 Encounter for screening examination for other mental health and behavioral disorders: Secondary | ICD-10-CM | POA: Diagnosis not present

## 2021-09-20 DIAGNOSIS — R232 Flushing: Secondary | ICD-10-CM | POA: Diagnosis not present

## 2021-09-20 DIAGNOSIS — Z1331 Encounter for screening for depression: Secondary | ICD-10-CM | POA: Diagnosis not present

## 2021-09-20 DIAGNOSIS — R6882 Decreased libido: Secondary | ICD-10-CM | POA: Diagnosis not present

## 2021-09-28 DIAGNOSIS — Z6832 Body mass index (BMI) 32.0-32.9, adult: Secondary | ICD-10-CM | POA: Diagnosis not present

## 2021-09-28 DIAGNOSIS — N951 Menopausal and female climacteric states: Secondary | ICD-10-CM | POA: Diagnosis not present

## 2021-09-28 DIAGNOSIS — I10 Essential (primary) hypertension: Secondary | ICD-10-CM | POA: Diagnosis not present

## 2021-09-29 DIAGNOSIS — Z0000 Encounter for general adult medical examination without abnormal findings: Secondary | ICD-10-CM | POA: Diagnosis not present

## 2021-09-29 DIAGNOSIS — Z23 Encounter for immunization: Secondary | ICD-10-CM | POA: Diagnosis not present

## 2021-09-29 DIAGNOSIS — I10 Essential (primary) hypertension: Secondary | ICD-10-CM | POA: Diagnosis not present

## 2021-09-29 DIAGNOSIS — K219 Gastro-esophageal reflux disease without esophagitis: Secondary | ICD-10-CM | POA: Diagnosis not present

## 2021-09-29 DIAGNOSIS — F900 Attention-deficit hyperactivity disorder, predominantly inattentive type: Secondary | ICD-10-CM | POA: Diagnosis not present

## 2021-09-29 DIAGNOSIS — G4700 Insomnia, unspecified: Secondary | ICD-10-CM | POA: Diagnosis not present

## 2021-10-26 DIAGNOSIS — Z6832 Body mass index (BMI) 32.0-32.9, adult: Secondary | ICD-10-CM | POA: Diagnosis not present

## 2021-10-26 DIAGNOSIS — Z713 Dietary counseling and surveillance: Secondary | ICD-10-CM | POA: Diagnosis not present

## 2021-10-26 DIAGNOSIS — I10 Essential (primary) hypertension: Secondary | ICD-10-CM | POA: Diagnosis not present

## 2021-10-26 DIAGNOSIS — Z7182 Exercise counseling: Secondary | ICD-10-CM | POA: Diagnosis not present

## 2021-12-07 DIAGNOSIS — N951 Menopausal and female climacteric states: Secondary | ICD-10-CM | POA: Diagnosis not present

## 2021-12-07 DIAGNOSIS — Z6832 Body mass index (BMI) 32.0-32.9, adult: Secondary | ICD-10-CM | POA: Diagnosis not present

## 2021-12-07 DIAGNOSIS — Z713 Dietary counseling and surveillance: Secondary | ICD-10-CM | POA: Diagnosis not present

## 2021-12-07 DIAGNOSIS — I10 Essential (primary) hypertension: Secondary | ICD-10-CM | POA: Diagnosis not present

## 2022-01-04 DIAGNOSIS — N951 Menopausal and female climacteric states: Secondary | ICD-10-CM | POA: Diagnosis not present

## 2022-01-04 DIAGNOSIS — E538 Deficiency of other specified B group vitamins: Secondary | ICD-10-CM | POA: Diagnosis not present

## 2022-01-04 DIAGNOSIS — Z6832 Body mass index (BMI) 32.0-32.9, adult: Secondary | ICD-10-CM | POA: Diagnosis not present

## 2022-01-04 DIAGNOSIS — I10 Essential (primary) hypertension: Secondary | ICD-10-CM | POA: Diagnosis not present

## 2022-01-04 DIAGNOSIS — Z7182 Exercise counseling: Secondary | ICD-10-CM | POA: Diagnosis not present

## 2022-01-31 DIAGNOSIS — Z713 Dietary counseling and surveillance: Secondary | ICD-10-CM | POA: Diagnosis not present

## 2022-01-31 DIAGNOSIS — Z6832 Body mass index (BMI) 32.0-32.9, adult: Secondary | ICD-10-CM | POA: Diagnosis not present

## 2022-01-31 DIAGNOSIS — I10 Essential (primary) hypertension: Secondary | ICD-10-CM | POA: Diagnosis not present

## 2022-03-01 DIAGNOSIS — I10 Essential (primary) hypertension: Secondary | ICD-10-CM | POA: Diagnosis not present

## 2022-03-01 DIAGNOSIS — Z6831 Body mass index (BMI) 31.0-31.9, adult: Secondary | ICD-10-CM | POA: Diagnosis not present

## 2022-03-01 DIAGNOSIS — Z713 Dietary counseling and surveillance: Secondary | ICD-10-CM | POA: Diagnosis not present

## 2022-03-01 DIAGNOSIS — Z7182 Exercise counseling: Secondary | ICD-10-CM | POA: Diagnosis not present

## 2022-03-10 DIAGNOSIS — Z23 Encounter for immunization: Secondary | ICD-10-CM | POA: Diagnosis not present

## 2022-04-03 DIAGNOSIS — N951 Menopausal and female climacteric states: Secondary | ICD-10-CM | POA: Diagnosis not present

## 2022-04-03 DIAGNOSIS — I10 Essential (primary) hypertension: Secondary | ICD-10-CM | POA: Diagnosis not present

## 2022-04-03 DIAGNOSIS — Z6831 Body mass index (BMI) 31.0-31.9, adult: Secondary | ICD-10-CM | POA: Diagnosis not present

## 2022-04-03 DIAGNOSIS — E669 Obesity, unspecified: Secondary | ICD-10-CM | POA: Diagnosis not present

## 2022-04-03 DIAGNOSIS — Z713 Dietary counseling and surveillance: Secondary | ICD-10-CM | POA: Diagnosis not present

## 2022-05-01 DIAGNOSIS — E669 Obesity, unspecified: Secondary | ICD-10-CM | POA: Diagnosis not present

## 2022-05-01 DIAGNOSIS — N951 Menopausal and female climacteric states: Secondary | ICD-10-CM | POA: Diagnosis not present

## 2022-05-01 DIAGNOSIS — E282 Polycystic ovarian syndrome: Secondary | ICD-10-CM | POA: Diagnosis not present

## 2022-05-01 DIAGNOSIS — I10 Essential (primary) hypertension: Secondary | ICD-10-CM | POA: Diagnosis not present

## 2022-05-01 DIAGNOSIS — N912 Amenorrhea, unspecified: Secondary | ICD-10-CM | POA: Diagnosis not present

## 2022-05-01 DIAGNOSIS — R5383 Other fatigue: Secondary | ICD-10-CM | POA: Diagnosis not present

## 2022-05-01 DIAGNOSIS — N959 Unspecified menopausal and perimenopausal disorder: Secondary | ICD-10-CM | POA: Diagnosis not present

## 2022-05-01 DIAGNOSIS — E039 Hypothyroidism, unspecified: Secondary | ICD-10-CM | POA: Diagnosis not present

## 2022-05-01 DIAGNOSIS — N926 Irregular menstruation, unspecified: Secondary | ICD-10-CM | POA: Diagnosis not present

## 2022-05-02 DIAGNOSIS — G4700 Insomnia, unspecified: Secondary | ICD-10-CM | POA: Diagnosis not present

## 2022-05-02 DIAGNOSIS — F900 Attention-deficit hyperactivity disorder, predominantly inattentive type: Secondary | ICD-10-CM | POA: Diagnosis not present

## 2022-05-02 DIAGNOSIS — I10 Essential (primary) hypertension: Secondary | ICD-10-CM | POA: Diagnosis not present

## 2022-05-02 DIAGNOSIS — K219 Gastro-esophageal reflux disease without esophagitis: Secondary | ICD-10-CM | POA: Diagnosis not present

## 2022-05-30 DIAGNOSIS — E669 Obesity, unspecified: Secondary | ICD-10-CM | POA: Diagnosis not present

## 2022-05-30 DIAGNOSIS — Z7182 Exercise counseling: Secondary | ICD-10-CM | POA: Diagnosis not present

## 2022-05-30 DIAGNOSIS — I10 Essential (primary) hypertension: Secondary | ICD-10-CM | POA: Diagnosis not present

## 2022-06-29 DIAGNOSIS — I10 Essential (primary) hypertension: Secondary | ICD-10-CM | POA: Diagnosis not present

## 2022-06-29 DIAGNOSIS — E7800 Pure hypercholesterolemia, unspecified: Secondary | ICD-10-CM | POA: Diagnosis not present

## 2022-06-29 DIAGNOSIS — N951 Menopausal and female climacteric states: Secondary | ICD-10-CM | POA: Diagnosis not present

## 2022-06-29 DIAGNOSIS — E669 Obesity, unspecified: Secondary | ICD-10-CM | POA: Diagnosis not present

## 2022-06-29 DIAGNOSIS — Z6831 Body mass index (BMI) 31.0-31.9, adult: Secondary | ICD-10-CM | POA: Diagnosis not present

## 2022-07-18 DIAGNOSIS — Z1231 Encounter for screening mammogram for malignant neoplasm of breast: Secondary | ICD-10-CM | POA: Diagnosis not present

## 2022-08-11 DIAGNOSIS — N951 Menopausal and female climacteric states: Secondary | ICD-10-CM | POA: Diagnosis not present

## 2022-08-11 DIAGNOSIS — I10 Essential (primary) hypertension: Secondary | ICD-10-CM | POA: Diagnosis not present

## 2022-08-11 DIAGNOSIS — E669 Obesity, unspecified: Secondary | ICD-10-CM | POA: Diagnosis not present

## 2022-08-11 DIAGNOSIS — E7800 Pure hypercholesterolemia, unspecified: Secondary | ICD-10-CM | POA: Diagnosis not present

## 2022-10-06 DIAGNOSIS — Z0000 Encounter for general adult medical examination without abnormal findings: Secondary | ICD-10-CM | POA: Diagnosis not present

## 2022-10-06 DIAGNOSIS — F900 Attention-deficit hyperactivity disorder, predominantly inattentive type: Secondary | ICD-10-CM | POA: Diagnosis not present

## 2022-10-06 DIAGNOSIS — G4700 Insomnia, unspecified: Secondary | ICD-10-CM | POA: Diagnosis not present

## 2022-10-06 DIAGNOSIS — Z13220 Encounter for screening for lipoid disorders: Secondary | ICD-10-CM | POA: Diagnosis not present

## 2022-10-06 DIAGNOSIS — K219 Gastro-esophageal reflux disease without esophagitis: Secondary | ICD-10-CM | POA: Diagnosis not present

## 2022-10-06 DIAGNOSIS — I10 Essential (primary) hypertension: Secondary | ICD-10-CM | POA: Diagnosis not present

## 2022-10-12 DIAGNOSIS — N951 Menopausal and female climacteric states: Secondary | ICD-10-CM | POA: Diagnosis not present

## 2022-10-12 DIAGNOSIS — E039 Hypothyroidism, unspecified: Secondary | ICD-10-CM | POA: Diagnosis not present

## 2022-10-26 DIAGNOSIS — R5383 Other fatigue: Secondary | ICD-10-CM | POA: Diagnosis not present

## 2022-10-26 DIAGNOSIS — R232 Flushing: Secondary | ICD-10-CM | POA: Diagnosis not present

## 2022-10-26 DIAGNOSIS — N951 Menopausal and female climacteric states: Secondary | ICD-10-CM | POA: Diagnosis not present

## 2022-10-26 DIAGNOSIS — G479 Sleep disorder, unspecified: Secondary | ICD-10-CM | POA: Diagnosis not present

## 2022-11-21 DIAGNOSIS — E039 Hypothyroidism, unspecified: Secondary | ICD-10-CM | POA: Diagnosis not present

## 2022-11-21 DIAGNOSIS — N951 Menopausal and female climacteric states: Secondary | ICD-10-CM | POA: Diagnosis not present

## 2022-11-28 DIAGNOSIS — Z6829 Body mass index (BMI) 29.0-29.9, adult: Secondary | ICD-10-CM | POA: Diagnosis not present

## 2022-11-28 DIAGNOSIS — G479 Sleep disorder, unspecified: Secondary | ICD-10-CM | POA: Diagnosis not present

## 2022-11-28 DIAGNOSIS — N951 Menopausal and female climacteric states: Secondary | ICD-10-CM | POA: Diagnosis not present

## 2022-11-28 DIAGNOSIS — R232 Flushing: Secondary | ICD-10-CM | POA: Diagnosis not present

## 2023-02-21 DIAGNOSIS — F900 Attention-deficit hyperactivity disorder, predominantly inattentive type: Secondary | ICD-10-CM | POA: Diagnosis not present

## 2023-02-21 DIAGNOSIS — G4700 Insomnia, unspecified: Secondary | ICD-10-CM | POA: Diagnosis not present

## 2023-02-21 DIAGNOSIS — R0681 Apnea, not elsewhere classified: Secondary | ICD-10-CM | POA: Diagnosis not present

## 2023-02-22 DIAGNOSIS — Z23 Encounter for immunization: Secondary | ICD-10-CM | POA: Diagnosis not present

## 2023-04-17 DIAGNOSIS — G4700 Insomnia, unspecified: Secondary | ICD-10-CM | POA: Diagnosis not present

## 2023-04-17 DIAGNOSIS — I10 Essential (primary) hypertension: Secondary | ICD-10-CM | POA: Diagnosis not present

## 2023-04-17 DIAGNOSIS — K219 Gastro-esophageal reflux disease without esophagitis: Secondary | ICD-10-CM | POA: Diagnosis not present

## 2023-04-17 DIAGNOSIS — F900 Attention-deficit hyperactivity disorder, predominantly inattentive type: Secondary | ICD-10-CM | POA: Diagnosis not present

## 2023-06-29 DIAGNOSIS — H33311 Horseshoe tear of retina without detachment, right eye: Secondary | ICD-10-CM | POA: Diagnosis not present

## 2023-06-29 DIAGNOSIS — H43811 Vitreous degeneration, right eye: Secondary | ICD-10-CM | POA: Diagnosis not present

## 2023-06-29 DIAGNOSIS — H43822 Vitreomacular adhesion, left eye: Secondary | ICD-10-CM | POA: Diagnosis not present

## 2023-06-29 DIAGNOSIS — H4311 Vitreous hemorrhage, right eye: Secondary | ICD-10-CM | POA: Diagnosis not present

## 2023-06-29 DIAGNOSIS — H2513 Age-related nuclear cataract, bilateral: Secondary | ICD-10-CM | POA: Diagnosis not present

## 2023-07-10 DIAGNOSIS — Z6835 Body mass index (BMI) 35.0-35.9, adult: Secondary | ICD-10-CM | POA: Diagnosis not present

## 2023-07-10 DIAGNOSIS — H4311 Vitreous hemorrhage, right eye: Secondary | ICD-10-CM | POA: Diagnosis not present

## 2023-07-10 DIAGNOSIS — H31091 Other chorioretinal scars, right eye: Secondary | ICD-10-CM | POA: Diagnosis not present

## 2023-07-10 DIAGNOSIS — H43811 Vitreous degeneration, right eye: Secondary | ICD-10-CM | POA: Diagnosis not present

## 2023-07-10 DIAGNOSIS — H43822 Vitreomacular adhesion, left eye: Secondary | ICD-10-CM | POA: Diagnosis not present

## 2023-07-10 DIAGNOSIS — E66812 Obesity, class 2: Secondary | ICD-10-CM | POA: Diagnosis not present

## 2023-07-25 DIAGNOSIS — H4311 Vitreous hemorrhage, right eye: Secondary | ICD-10-CM | POA: Diagnosis not present

## 2023-07-25 DIAGNOSIS — H43811 Vitreous degeneration, right eye: Secondary | ICD-10-CM | POA: Diagnosis not present

## 2023-07-25 DIAGNOSIS — H43822 Vitreomacular adhesion, left eye: Secondary | ICD-10-CM | POA: Diagnosis not present

## 2023-07-25 DIAGNOSIS — H31091 Other chorioretinal scars, right eye: Secondary | ICD-10-CM | POA: Diagnosis not present

## 2023-07-30 DIAGNOSIS — Z1231 Encounter for screening mammogram for malignant neoplasm of breast: Secondary | ICD-10-CM | POA: Diagnosis not present

## 2023-08-07 DIAGNOSIS — E66812 Obesity, class 2: Secondary | ICD-10-CM | POA: Diagnosis not present

## 2023-08-07 DIAGNOSIS — Z6835 Body mass index (BMI) 35.0-35.9, adult: Secondary | ICD-10-CM | POA: Diagnosis not present

## 2023-11-16 DIAGNOSIS — I10 Essential (primary) hypertension: Secondary | ICD-10-CM | POA: Diagnosis not present

## 2023-11-16 DIAGNOSIS — E782 Mixed hyperlipidemia: Secondary | ICD-10-CM | POA: Diagnosis not present

## 2023-11-16 DIAGNOSIS — F900 Attention-deficit hyperactivity disorder, predominantly inattentive type: Secondary | ICD-10-CM | POA: Diagnosis not present

## 2023-11-16 DIAGNOSIS — G4700 Insomnia, unspecified: Secondary | ICD-10-CM | POA: Diagnosis not present

## 2024-01-24 DIAGNOSIS — F900 Attention-deficit hyperactivity disorder, predominantly inattentive type: Secondary | ICD-10-CM | POA: Diagnosis not present

## 2024-04-08 DIAGNOSIS — Z780 Asymptomatic menopausal state: Secondary | ICD-10-CM | POA: Diagnosis not present

## 2024-04-08 DIAGNOSIS — E66812 Obesity, class 2: Secondary | ICD-10-CM | POA: Diagnosis not present

## 2024-04-08 DIAGNOSIS — Z6835 Body mass index (BMI) 35.0-35.9, adult: Secondary | ICD-10-CM | POA: Diagnosis not present

## 2024-05-06 DIAGNOSIS — Z780 Asymptomatic menopausal state: Secondary | ICD-10-CM | POA: Diagnosis not present

## 2024-05-06 DIAGNOSIS — Z6835 Body mass index (BMI) 35.0-35.9, adult: Secondary | ICD-10-CM | POA: Diagnosis not present
